# Patient Record
Sex: Female | Born: 1960 | Race: White | Hispanic: No | Smoking: Former smoker
Health system: Southern US, Community
[De-identification: ages and names within clinical notes are randomized; demographics above are authoritative.]

## PROBLEM LIST (undated history)

## (undated) DIAGNOSIS — R51 Headache: Secondary | ICD-10-CM

## (undated) DIAGNOSIS — Z789 Other specified health status: Secondary | ICD-10-CM

## (undated) DIAGNOSIS — N3281 Overactive bladder: Secondary | ICD-10-CM

## (undated) DIAGNOSIS — T4145XA Adverse effect of unspecified anesthetic, initial encounter: Secondary | ICD-10-CM

## (undated) DIAGNOSIS — E785 Hyperlipidemia, unspecified: Secondary | ICD-10-CM

## (undated) DIAGNOSIS — R238 Other skin changes: Secondary | ICD-10-CM

## (undated) DIAGNOSIS — Z460 Encounter for fitting and adjustment of spectacles and contact lenses: Secondary | ICD-10-CM

## (undated) DIAGNOSIS — R112 Nausea with vomiting, unspecified: Secondary | ICD-10-CM

## (undated) DIAGNOSIS — F3340 Major depressive disorder, recurrent, in remission, unspecified: Secondary | ICD-10-CM

## (undated) DIAGNOSIS — C801 Malignant (primary) neoplasm, unspecified: Secondary | ICD-10-CM

## (undated) DIAGNOSIS — Z9889 Other specified postprocedural states: Secondary | ICD-10-CM

## (undated) DIAGNOSIS — T8859XA Other complications of anesthesia, initial encounter: Secondary | ICD-10-CM

## (undated) HISTORY — PX: CHOLECYSTECTOMY: SHX55

## (undated) HISTORY — DX: Major depressive disorder, recurrent, in remission, unspecified: F33.40

## (undated) HISTORY — DX: Overactive bladder: N32.81

## (undated) HISTORY — DX: Hyperlipidemia, unspecified: E78.5

---
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
## 1998-08-09 ENCOUNTER — Other Ambulatory Visit: Admission: RE | Admit: 1998-08-09 | Discharge: 1998-08-09 | Payer: Self-pay | Admitting: Obstetrics & Gynecology

## 1999-02-26 HISTORY — PX: SHOULDER ARTHROSCOPY: SHX128

## 1999-10-17 ENCOUNTER — Ambulatory Visit (HOSPITAL_BASED_OUTPATIENT_CLINIC_OR_DEPARTMENT_OTHER): Admission: RE | Admit: 1999-10-17 | Discharge: 1999-10-17 | Payer: Self-pay | Admitting: Orthopedic Surgery

## 1999-12-20 ENCOUNTER — Other Ambulatory Visit: Admission: RE | Admit: 1999-12-20 | Discharge: 1999-12-20 | Payer: Self-pay | Admitting: Obstetrics & Gynecology

## 2000-05-02 ENCOUNTER — Emergency Department (HOSPITAL_COMMUNITY): Admission: EM | Admit: 2000-05-02 | Discharge: 2000-05-02 | Payer: Self-pay | Admitting: Emergency Medicine

## 2001-01-07 ENCOUNTER — Other Ambulatory Visit: Admission: RE | Admit: 2001-01-07 | Discharge: 2001-01-07 | Payer: Self-pay | Admitting: Obstetrics & Gynecology

## 2002-02-02 ENCOUNTER — Other Ambulatory Visit: Admission: RE | Admit: 2002-02-02 | Discharge: 2002-02-02 | Payer: Self-pay | Admitting: Obstetrics & Gynecology

## 2002-02-25 HISTORY — PX: CARPAL TUNNEL RELEASE: SHX101

## 2002-05-12 ENCOUNTER — Ambulatory Visit (HOSPITAL_BASED_OUTPATIENT_CLINIC_OR_DEPARTMENT_OTHER): Admission: RE | Admit: 2002-05-12 | Discharge: 2002-05-12 | Payer: Self-pay | Admitting: Orthopedic Surgery

## 2003-02-04 ENCOUNTER — Other Ambulatory Visit: Admission: RE | Admit: 2003-02-04 | Discharge: 2003-02-04 | Payer: Self-pay | Admitting: Obstetrics & Gynecology

## 2003-10-05 ENCOUNTER — Encounter: Admission: RE | Admit: 2003-10-05 | Discharge: 2003-10-05 | Payer: Self-pay | Admitting: Obstetrics & Gynecology

## 2004-02-08 ENCOUNTER — Other Ambulatory Visit: Admission: RE | Admit: 2004-02-08 | Discharge: 2004-02-08 | Payer: Self-pay | Admitting: Obstetrics & Gynecology

## 2005-12-10 ENCOUNTER — Encounter: Admission: RE | Admit: 2005-12-10 | Discharge: 2005-12-10 | Payer: Self-pay | Admitting: Family Medicine

## 2005-12-12 ENCOUNTER — Encounter: Admission: RE | Admit: 2005-12-12 | Discharge: 2006-03-12 | Payer: Self-pay | Admitting: Orthopedic Surgery

## 2006-12-29 ENCOUNTER — Encounter: Admission: RE | Admit: 2006-12-29 | Discharge: 2006-12-29 | Payer: Self-pay | Admitting: Family Medicine

## 2009-05-29 ENCOUNTER — Encounter: Admission: RE | Admit: 2009-05-29 | Discharge: 2009-05-29 | Payer: Self-pay | Admitting: Family Medicine

## 2010-03-18 ENCOUNTER — Encounter: Payer: Self-pay | Admitting: Family Medicine

## 2010-05-21 ENCOUNTER — Other Ambulatory Visit: Payer: Self-pay | Admitting: Obstetrics & Gynecology

## 2010-05-21 DIAGNOSIS — Z1231 Encounter for screening mammogram for malignant neoplasm of breast: Secondary | ICD-10-CM

## 2010-06-11 ENCOUNTER — Ambulatory Visit: Payer: Self-pay

## 2010-07-13 NOTE — Op Note (Signed)
NAME:  Regina Lindsey, Regina Lindsey                         ACCOUNT NO.:  [id number]   MEDICAL RECORD NO.:  [id number]                   PATIENT TYPE:  AMB   LOCATION:  DSC                                  FACILITY:  [facility]   PHYSICIAN:  Feliberto Gottron. Turner Daniels, M.D.                DATE OF BIRTH:  07/08/1960   DATE OF PROCEDURE:  05/12/2002  DATE OF DISCHARGE:                                 OPERATIVE REPORT   PREOPERATIVE DIAGNOSIS:  Right carpal tunnel syndrome.   POSTOPERATIVE DIAGNOSIS:  Right carpal tunnel syndrome.   OPERATION PERFORMED:  Right carpal tunnel release.   SURGEON:  Feliberto Gottron. Turner Daniels, M.D.   ASSISTANTLaural Benes. Jannet Mantis.   ANESTHESIA:  IV regional.   ESTIMATED BLOOD LOSS:  Minimal.   FLUIDS REPLACED:  500 cc crystalloid.   DRAINS:  None.   TOURNIQUET TIME:  None.   INDICATIONS FOR PROCEDURE:  The patient is a 50 year old woman with EMG  proven right carpal tunnel syndrome which has failed conservative treatment  with anti-inflammatory medicines, bracing and observation.  Now desires  elective right carpal tunnel release.  The risks and benefits of surgery  were understood by the patient.   DESCRIPTION OF PROCEDURE:  The patient was identified by arm band and taken  to the operating room at [facility] where appropriate  anesthetic monitors were attached and IV regional anesthesia induced into  the right upper extremity using a forearm tourniquet technique.  The right  hand was then prepped and draped in the usual sterile fashion from the  fingertips to the tourniquet and we began the procedure by making a midline  volar incision starting at the wrist flexion crease and going distally for  approximately 3 to  4 cm through the skin and subcutaneous tissue, down to the transverse palmar  fascia.  A small incision was made in the transverse palmar fascia distally  allowing passage of a Therapist, nutritional into the carpal tunnel.  We kept the  [facility]  volar on the ligament and then cut down onto the [facility] performing the carpal tunnel release and being careful to avoid any  entanglement with the median nerve.  This incision in the ligament was run  up into the palmar fascia using the black-handled scissors under direct  visualization.  We then examined the median nerve, found it to be hour-  glassed down as it passed through the carpal tunnel and thoroughly  decompressed now.  There were no ganglia or masses found in the carpal  tunnel.  The wound was washed out with normal saline solution and  then the skin only was closed with running 4-0 nylon suture.  A dressing of  Xeroform, 4 x 4 dressing sponges, Webril and Ace wrap was applied.  The  tourniquet was let down and the patient was awakened and taken to the  recovery  room without difficulty.                                                Feliberto Gottron. Turner Daniels, M.D.    Ovid Curd  D:  05/12/2002  T:  05/12/2002  Job:  [id number]

## 2010-07-13 NOTE — Op Note (Signed)
[place]  Patient:    Regina Lindsey, Regina Lindsey                      MRN: [id number] Proc. Date: 10/17/99 Adm. Date:  [id number] Attending:  Alinda Deem                           Operative Report  PREOPERATIVE DIAGNOSIS:  Right shoulder SLAP tear and impingement.  POSTOPERATIVE DIAGNOSIS:  Right shoulder SLAP tear and impingement.  OPERATION PERFORMED:  Right shoulder arthroscopic anterior inferior acromioplasty and removal of degenerative tear of the superior labrum.  The labral anchor was actually stable.  SURGEON:  Alinda Deem, M.D.  ASSISTANT:  Dorthula Matas, P.A.-C.  ANESTHESIA:  Interscalene block and general LMA.  ESTIMATED BLOOD LOSS:  Minimal.  FLUID REPLACEMENT:  1L crystalloid.  DRAINS:  None.  TOURNIQUET TIME:  None.  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  She also has signs and symptoms consistent with impingement.  In any event because of failure of conservative measures, she desires arthroscopic evaluation and treatment of her right shoulder.  DESCRIPTION OF PROCEDURE:  The patient was identified by arm band and taken to the operating room at [facility] where the appropriate anesthetic monitors were attached and interscalene block anesthesia induced to the right upper extremity followed by general LMA anesthesia.  She was then placed in the beach chair position and the right upper extremity prepped and draped in the usual sterile fashion from the wrist to the hemithorax.  The skin along the anterolateral and posterior aspects of the acromion process was infiltrated with 2 to 3 cc of 0.5% Marcaine with epinephrine solution and no back flow was noted.  A #11 blade was used to make standard portals, 1.5 cm anterior  to the [facility] joint, lateral to the junction of the middle and posterior thirds of the acromion and posterior to the posterolateral corner of the acromion process.  The inflow was placed anteriorly.  The arthroscope laterally and a 4.2 mm barracuda sucker shaver posteriorly allowing removal of inflamed subacromial bursa to the identify the CA ligament as well as the subacromial spur.  Underwater electrocautery was used to control bleeding and gravity was used for the fluid inflow.  Using a 4.5 mm hooded Vortex bur, we then resected the subacromial spur and inspected the external aspects of the rotator cuff and found it to be intact.  The arthroscope was then repositioned into the glenohumeral joint where I identified a degenerative fray tear of the superior labrum which was removed with a 4.2 mm barracuda sucker shaver.  A small amount of chondromalacia of the glenoid was incidentally debrided grade 2.  The humeral head was in excellent condition as were the subscapularis tendons, the biceps tendon, supra- and infraspinatus insertions.  At this point the shoulder was washed out with normal saline solution and the arthroscopic instruments were removed.  A dressing of Xeroform, 4 x 8 dressing sponges and paper tape applied with the sling.  The patient was then awakened and taken to the recovery room without difficulty. DD:  10/17/99 TD:  10/18/99 Job: [id number]

## 2011-01-25 ENCOUNTER — Other Ambulatory Visit: Payer: Self-pay | Admitting: Family Medicine

## 2011-01-25 ENCOUNTER — Ambulatory Visit
Admission: RE | Admit: 2011-01-25 | Discharge: 2011-01-25 | Disposition: A | Discharge status: Home / Self Care | Payer: PRIVATE HEALTH INSURANCE | Source: Ambulatory Visit | Attending: Family Medicine | Admitting: Family Medicine

## 2011-01-25 DIAGNOSIS — R1032 Left lower quadrant pain: Secondary | ICD-10-CM

## 2011-01-25 MED ORDER — IOHEXOL 300 MG/ML  SOLN
125.0000 mL | Freq: Once | INTRAMUSCULAR | Status: AC | PRN
  Administered 2011-01-25: 125 mL via INTRAVENOUS

## 2011-08-27 ENCOUNTER — Other Ambulatory Visit: Payer: Self-pay | Admitting: Obstetrics & Gynecology

## 2011-08-27 DIAGNOSIS — Z1231 Encounter for screening mammogram for malignant neoplasm of breast: Secondary | ICD-10-CM

## 2011-09-09 ENCOUNTER — Ambulatory Visit: Payer: PRIVATE HEALTH INSURANCE

## 2012-01-27 ENCOUNTER — Ambulatory Visit
Admission: RE | Admit: 2012-01-27 | Discharge: 2012-01-27 | Disposition: A | Discharge status: Home / Self Care | Payer: BC Managed Care – PPO | Source: Ambulatory Visit | Attending: Obstetrics & Gynecology | Admitting: Obstetrics & Gynecology

## 2012-01-27 DIAGNOSIS — Z1231 Encounter for screening mammogram for malignant neoplasm of breast: Secondary | ICD-10-CM

## 2012-03-23 ENCOUNTER — Encounter (HOSPITAL_BASED_OUTPATIENT_CLINIC_OR_DEPARTMENT_OTHER): Payer: Self-pay | Admitting: *Deleted

## 2012-03-23 NOTE — Progress Notes (Signed)
Pt denies any heart or resp problems-denies sleep apnea [facility]

## 2012-03-26 ENCOUNTER — Other Ambulatory Visit: Payer: Self-pay | Admitting: Orthopedic Surgery

## 2012-03-27 ENCOUNTER — Encounter (HOSPITAL_BASED_OUTPATIENT_CLINIC_OR_DEPARTMENT_OTHER): Payer: Self-pay | Admitting: Certified Registered Nurse Anesthetist

## 2012-03-27 ENCOUNTER — Ambulatory Visit (HOSPITAL_BASED_OUTPATIENT_CLINIC_OR_DEPARTMENT_OTHER)
Admission: RE | Admit: 2012-03-27 | Discharge: 2012-03-27 | Disposition: A | Discharge status: Home / Self Care | Payer: BC Managed Care – PPO | Source: Ambulatory Visit | Attending: Orthopedic Surgery | Admitting: Orthopedic Surgery

## 2012-03-27 ENCOUNTER — Encounter (HOSPITAL_BASED_OUTPATIENT_CLINIC_OR_DEPARTMENT_OTHER): Payer: Self-pay | Admitting: *Deleted

## 2012-03-27 ENCOUNTER — Ambulatory Visit (HOSPITAL_BASED_OUTPATIENT_CLINIC_OR_DEPARTMENT_OTHER): Payer: BC Managed Care – PPO | Admitting: Certified Registered Nurse Anesthetist

## 2012-03-27 ENCOUNTER — Encounter (HOSPITAL_BASED_OUTPATIENT_CLINIC_OR_DEPARTMENT_OTHER)
Admission: RE | Disposition: A | Discharge status: Home / Self Care | Payer: Self-pay | Source: Ambulatory Visit | Attending: Orthopedic Surgery

## 2012-03-27 DIAGNOSIS — Z87891 Personal history of nicotine dependence: Secondary | ICD-10-CM | POA: Insufficient documentation

## 2012-03-27 DIAGNOSIS — M23349 Other meniscus derangements, anterior horn of lateral meniscus, unspecified knee: Secondary | ICD-10-CM | POA: Insufficient documentation

## 2012-03-27 DIAGNOSIS — S83289A Other tear of lateral meniscus, current injury, unspecified knee, initial encounter: Secondary | ICD-10-CM

## 2012-03-27 DIAGNOSIS — Z6841 Body Mass Index (BMI) 40.0 and over, adult: Secondary | ICD-10-CM | POA: Insufficient documentation

## 2012-03-27 HISTORY — DX: Other specified health status: Z78.9

## 2012-03-27 HISTORY — DX: Headache: R51

## 2012-03-27 HISTORY — DX: Adverse effect of unspecified anesthetic, initial encounter: T41.45XA

## 2012-03-27 HISTORY — DX: Other complications of anesthesia, initial encounter: T88.59XA

## 2012-03-27 HISTORY — DX: Encounter for fitting and adjustment of spectacles and contact lenses: Z46.0

## 2012-03-27 HISTORY — PX: KNEE ARTHROSCOPY WITH LATERAL MENISECTOMY: SHX6193

## 2012-03-27 SURGERY — ARTHROSCOPY, KNEE, WITH LATERAL MENISCECTOMY
Anesthesia: General | Site: Knee | Laterality: Left | Wound class: Clean

## 2012-03-27 MED ORDER — OXYCODONE-ACETAMINOPHEN 5-325 MG PO TABS: 1.0000 | ORAL_TABLET | Freq: Four times a day (QID) | ORAL | Status: DC | PRN

## 2012-03-27 MED ORDER — OXYCODONE HCL 5 MG PO TABS: 5.0000 mg | ORAL_TABLET | Freq: Once | ORAL | Status: DC | PRN

## 2012-03-27 MED ORDER — FENTANYL CITRATE 0.05 MG/ML IJ SOLN: 50.0000 ug | INTRAMUSCULAR | Status: DC | PRN

## 2012-03-27 MED ORDER — PROPOFOL 10 MG/ML IV BOLUS
INTRAVENOUS | Status: DC | PRN
  Administered 2012-03-27: 200 mg via INTRAVENOUS

## 2012-03-27 MED ORDER — ONDANSETRON HCL 4 MG/2ML IJ SOLN: 4.0000 mg | Freq: Four times a day (QID) | INTRAMUSCULAR | Status: DC | PRN

## 2012-03-27 MED ORDER — VANCOMYCIN HCL 1000 MG IV SOLR
1000.0000 mg | INTRAVENOUS | Status: DC | PRN
  Administered 2012-03-27: 1000 mg via INTRAVENOUS

## 2012-03-27 MED ORDER — PROMETHAZINE HCL 25 MG PO TABS: 25.0000 mg | ORAL_TABLET | Freq: Four times a day (QID) | ORAL | Status: DC | PRN

## 2012-03-27 MED ORDER — DEXAMETHASONE SODIUM PHOSPHATE 4 MG/ML IJ SOLN
INTRAMUSCULAR | Status: DC | PRN
  Administered 2012-03-27: 10 mg via INTRAVENOUS

## 2012-03-27 MED ORDER — CEFAZOLIN SODIUM-DEXTROSE 2-3 GM-% IV SOLR: 2.0000 g | INTRAVENOUS | Status: DC

## 2012-03-27 MED ORDER — FENTANYL CITRATE 0.05 MG/ML IJ SOLN
INTRAMUSCULAR | Status: DC | PRN
  Administered 2012-03-27 (×2): 25 ug via INTRAVENOUS
  Administered 2012-03-27: 50 ug via INTRAVENOUS
  Administered 2012-03-27 (×2): 25 ug via INTRAVENOUS

## 2012-03-27 MED ORDER — LACTATED RINGERS IV SOLN
INTRAVENOUS | Status: DC
  Administered 2012-03-27: 07:00:00 via INTRAVENOUS

## 2012-03-27 MED ORDER — ONDANSETRON HCL 4 MG/2ML IJ SOLN
INTRAMUSCULAR | Status: DC | PRN
  Administered 2012-03-27: 4 mg via INTRAVENOUS

## 2012-03-27 MED ORDER — BUPIVACAINE-EPINEPHRINE 0.5% -1:200000 IJ SOLN
INTRAMUSCULAR | Status: DC | PRN
  Administered 2012-03-27: 30 mL

## 2012-03-27 MED ORDER — LIDOCAINE HCL (CARDIAC) 20 MG/ML IV SOLN
INTRAVENOUS | Status: DC | PRN
  Administered 2012-03-27: 60 mg via INTRAVENOUS

## 2012-03-27 MED ORDER — SODIUM CHLORIDE 0.9 % IR SOLN
Status: DC | PRN
  Administered 2012-03-27: 3000 mL

## 2012-03-27 MED ORDER — OXYCODONE HCL 5 MG/5ML PO SOLN: 5.0000 mg | Freq: Once | ORAL | Status: DC | PRN

## 2012-03-27 MED ORDER — HYDROMORPHONE HCL PF 1 MG/ML IJ SOLN
0.2500 mg | INTRAMUSCULAR | Status: DC | PRN
  Administered 2012-03-27: 0.5 mg via INTRAVENOUS

## 2012-03-27 SURGICAL SUPPLY — 46 items
APL SKNCLS STERI-STRIP NONHPOA (GAUZE/BANDAGES/DRESSINGS) ×1
BANDAGE ELASTIC 6 VELCRO ST LF (GAUZE/BANDAGES/DRESSINGS) ×2 IMPLANT
BANDAGE ESMARK 6X9 LF (GAUZE/BANDAGES/DRESSINGS) IMPLANT
BENZOIN TINCTURE PRP APPL 2/3 (GAUZE/BANDAGES/DRESSINGS) ×2 IMPLANT
BLADE CUTTER GATOR 3.5 (BLADE) ×2 IMPLANT
BNDG CMPR 9X6 STRL LF SNTH (GAUZE/BANDAGES/DRESSINGS)
BNDG ESMARK 6X9 LF (GAUZE/BANDAGES/DRESSINGS)
CANISTER OMNI JUG 16 LITER (MISCELLANEOUS) ×2 IMPLANT
CANISTER SUCTION 2500CC (MISCELLANEOUS) IMPLANT
CLOTH BEACON ORANGE TIMEOUT ST (SAFETY) ×2 IMPLANT
CUFF TOURNIQUET SINGLE 34IN LL (TOURNIQUET CUFF) IMPLANT
CUTTER KNOT PUSHER 2-0 FIBERWI (INSTRUMENTS) IMPLANT
CUTTER MENISCUS  4.2MM (BLADE)
CUTTER MENISCUS 4.2MM (BLADE) IMPLANT
DRAPE ARTHROSCOPY W/POUCH 90 (DRAPES) ×2 IMPLANT
DURAPREP 26ML APPLICATOR (WOUND CARE) ×2 IMPLANT
ELECT MENISCUS 165MM 90D (ELECTRODE) IMPLANT
ELECT REM PT RETURN 9FT ADLT (ELECTROSURGICAL)
ELECTRODE REM PT RTRN 9FT ADLT (ELECTROSURGICAL) IMPLANT
GLOVE BIO SURGEON STRL SZ 6.5 (GLOVE) ×1 IMPLANT
GLOVE BIO SURGEON STRL SZ8 (GLOVE) ×2 IMPLANT
GLOVE BIOGEL PI IND STRL 7.0 (GLOVE) IMPLANT
GLOVE BIOGEL PI IND STRL 8 (GLOVE) ×2 IMPLANT
GLOVE BIOGEL PI INDICATOR 7.0 (GLOVE) ×1
GLOVE BIOGEL PI INDICATOR 8 (GLOVE) ×1
GLOVE ORTHO TXT STRL SZ7.5 (GLOVE) ×2 IMPLANT
GOWN BRE IMP PREV XXLGXLNG (GOWN DISPOSABLE) ×3 IMPLANT
GOWN PREVENTION PLUS XLARGE (GOWN DISPOSABLE) ×1 IMPLANT
HOLDER KNEE FOAM BLUE (MISCELLANEOUS) ×2 IMPLANT
IMMOBILIZER KNEE 24 THIGH 36 (MISCELLANEOUS) IMPLANT
IMMOBILIZER KNEE 24 UNIV (MISCELLANEOUS) ×2
IV NS IRRIG 3000ML ARTHROMATIC (IV SOLUTION) ×4 IMPLANT
KNEE WRAP E Z 3 GEL PACK (MISCELLANEOUS) ×2 IMPLANT
PACK ARTHROSCOPY DSU (CUSTOM PROCEDURE TRAY) ×2 IMPLANT
PACK BASIN DAY SURGERY FS (CUSTOM PROCEDURE TRAY) ×2 IMPLANT
PENCIL BUTTON HOLSTER BLD 10FT (ELECTRODE) IMPLANT
SET ARTHROSCOPY TUBING (MISCELLANEOUS) ×2
SET ARTHROSCOPY TUBING LN (MISCELLANEOUS) ×1 IMPLANT
SLEEVE SCD COMPRESS KNEE MED (MISCELLANEOUS) IMPLANT
SPONGE GAUZE 4X4 12PLY (GAUZE/BANDAGES/DRESSINGS) ×2 IMPLANT
STRIP CLOSURE SKIN 1/2X4 (GAUZE/BANDAGES/DRESSINGS) ×2 IMPLANT
SUT MNCRL AB 4-0 PS2 18 (SUTURE) ×2 IMPLANT
TOWEL OR 17X24 6PK STRL BLUE (TOWEL DISPOSABLE) ×2 IMPLANT
TOWEL OR NON WOVEN STRL DISP B (DISPOSABLE) ×2 IMPLANT
WAND STAR VAC 90 (SURGICAL WAND) IMPLANT
WATER STERILE IRR 1000ML POUR (IV SOLUTION) ×2 IMPLANT

## 2012-03-27 NOTE — Anesthesia Postprocedure Evaluation (Signed)
Anesthesia Post Note  Patient: Regina Lindsey  Procedure(s) Performed: Procedure(s) (LRB): KNEE ARTHROSCOPY WITH LATERAL MENISECTOMY (Left)  Anesthesia type: General  Patient location: PACU  Post pain: Pain level controlled and Adequate analgesia  Post assessment: Post-op Vital signs reviewed, Patient's Cardiovascular Status Stable, Respiratory Function Stable, Patent Airway and Pain level controlled  Last Vitals:  Filed Vitals:   03/27/12 0830  BP: 144/94  Pulse: 99  Temp:   Resp: 15    Post vital signs: Reviewed and stable  Level of consciousness: awake, alert  and oriented  Complications: No apparent anesthesia complications

## 2012-03-27 NOTE — Transfer of Care (Signed)
Immediate Anesthesia Transfer of Care Note  Patient: Regina Lindsey  Procedure(s) Performed: Procedure(s) (LRB) with comments: KNEE ARTHROSCOPY WITH LATERAL MENISECTOMY (Left) - left knee arthroscopy with partial lateral menisectomy  Patient Location: PACU  Anesthesia Type:General  Level of Consciousness: awake, alert , oriented and patient cooperative  Airway & Oxygen Therapy: Patient Spontanous Breathing and Patient connected to face mask oxygen  Post-op Assessment: Report given to PACU RN and Post -op Vital signs reviewed and stable  Post vital signs: Reviewed and stable  Complications: No apparent anesthesia complications

## 2012-03-27 NOTE — Anesthesia Preprocedure Evaluation (Signed)
Anesthesia Evaluation  Patient identified by MRN, date of birth, ID band Patient awake    Reviewed: Allergy & Precautions, H&P , NPO status , Patient's Chart, lab work & pertinent test results  Airway Mallampati: II  Neck ROM: full    Dental   Pulmonary former smoker,          Cardiovascular     Neuro/Psych  Headaches,    GI/Hepatic   Endo/Other  Morbid obesity  Renal/GU      Musculoskeletal   Abdominal   Peds  Hematology   Anesthesia Other Findings   Reproductive/Obstetrics                           Anesthesia Physical Anesthesia Plan  ASA: II  Anesthesia Plan: General   Post-op Pain Management:    Induction: Intravenous  Airway Management Planned: LMA  Additional Equipment:   Intra-op Plan:   Post-operative Plan:   Informed Consent: I have reviewed the patients History and Physical, chart, labs and discussed the procedure including the risks, benefits and alternatives for the proposed anesthesia with the patient or authorized representative who has indicated his/her understanding and acceptance.     Plan Discussed with: CRNA and Surgeon  Anesthesia Plan Comments:         Anesthesia Quick Evaluation

## 2012-03-27 NOTE — H&P (Signed)
  PREOPERATIVE H&P  Chief Complaint: medial left knee pain HPI: Regina Lindsey is a 52 y.o. female who presents for preoperative history and physical with a diagnosis of left medial meniscus tear. Symptoms are rated as moderate to severe, and have been worsening.  This is significantly impairing activities of daily living.  She has elected for surgical management. She has failed injections, activity modification, NSAIDs.  This has been going on since November.  Past Medical History  Diagnosis Date  . No pertinent past medical history   . Complication of anesthesia     hard to wake up  . Headache   . Contact lens/glasses fitting     wears contacts or glasses   Past Surgical History  Procedure Date  . Tonsillectomy 1980    age 69  . Carpal tunnel release 2004    right  . Shoulder arthroscopy 2001    right  . Cholecystectomy   . Orif ankle fracture 1980    right   History   Social History  . Marital Status: Divorced    Spouse Name: N/A    Number of Children: N/A  . Years of Education: N/A   Social History Main Topics  . Smoking status: Former Smoker    Quit date: 03/23/2005  . Smokeless tobacco: None  . Alcohol Use: No  . Drug Use: No  . Sexually Active:    Other Topics Concern  . None   Social History Narrative  . None   History reviewed. No pertinent family history. Allergies  Allergen Reactions  . Celebrex (Celecoxib) Anaphylaxis  . Penicillins Anaphylaxis  . Sulfa Antibiotics Anaphylaxis  . Codeine Nausea And Vomiting  . Versed (Midazolam)     Opposite effect-hyper-mean   Prior to Admission medications   Medication Sig Start Date End Date Taking? Authorizing Provider  cyclobenzaprine (FLEXERIL) 10 MG tablet Take 10 mg by mouth 3 (three) times daily as needed.   Yes Historical Provider, MD  ibuprofen (ADVIL,MOTRIN) 800 MG tablet Take 800 mg by mouth every 8 (eight) hours as needed.   Yes Historical Provider, MD  eletriptan (RELPAX) 20 MG tablet One  tablet by mouth at onset of headache. May repeat in 2 hours if headache persists or recurs. may repeat in 2 hours if necessary    Historical Provider, MD     Positive ROS: All other systems have been reviewed and were otherwise negative with the exception of those mentioned in the HPI and as above.  Physical Exam: General: Alert, no acute distress Cardiovascular: No pedal edema Respiratory: No cyanosis, no use of accessory musculature GI: No organomegaly, abdomen is soft and non-tender Skin: No lesions in the area of chief complaint Neurologic: Sensation intact distally Psychiatric: Patient is competent for consent with normal mood and affect Lymphatic: No axillary or cervical lymphadenopathy  MUSCULOSKELETAL: left knee has medial joint line tenderness.  Assessment: LEFT KNEE: medial meniscus tear  Plan: Plan for Procedure(s): KNEE ARTHROSCOPY WITH medial MENISECTOMY  The risks benefits and alternatives were discussed with the patient including but not limited to the risks of nonoperative treatment, versus surgical intervention including infection, bleeding, nerve injury,  blood clots, cardiopulmonary complications, morbidity, mortality, incomplete relief of pain, among others, and they were willing to proceed.   Odell Choung P, MD Cell [phone] Pager [phone]  03/27/2012 7:11 AM

## 2012-03-27 NOTE — Anesthesia Procedure Notes (Signed)
Procedure Name: LMA Insertion Date/Time: 03/27/2012 7:39 AM Performed by: Codie Hainer D Pre-anesthesia Checklist: Patient identified, Emergency Drugs available, Suction available and Patient being monitored Patient Re-evaluated:Patient Re-evaluated prior to inductionOxygen Delivery Method: Circle System Utilized Preoxygenation: Pre-oxygenation with 100% oxygen Intubation Type: IV induction Ventilation: Mask ventilation without difficulty LMA: LMA inserted LMA Size: 4.0 Number of attempts: 1 Airway Equipment and Method: bite block Placement Confirmation: positive ETCO2 Tube secured with: Tape Dental Injury: Teeth and Oropharynx as per pre-operative assessment

## 2012-03-27 NOTE — Op Note (Signed)
03/27/2012  8:20 AM  PATIENT:  Regina Lindsey    PRE-OPERATIVE DIAGNOSIS:  Left knee occult medial meniscus tear  POST-OPERATIVE DIAGNOSIS:  Left knee anterior horn lateral meniscus tear  PROCEDURE:  KNEE ARTHROSCOPY WITH LATERAL MENISECTOMY  SURGEON:  Eulas Post, MD  PHYSICIAN ASSISTANT: None  ANESTHESIA:   General  PREOPERATIVE INDICATIONS:  Regina Lindsey is a  52 y.o. female who injured her knee while twisting back in November. She has had persistent medial sided knee pain. She had an MRI preoperatively that was questionable for medial meniscal pathology, but not diagnostic. She failed conservative measures and was miserable and wished for surgical intervention. She also has morbid obesity, increasing her risk, with a BMI of 46.  The risks benefits and alternatives were discussed with the patient preoperatively including but not limited to the risks of infection, bleeding, nerve injury, cardiopulmonary complications, the need for revision surgery, among others, and the patient was willing to proceed.  OPERATIVE IMPLANTS: None  OPERATIVE FINDINGS: The patellofemoral joint had some grade 2 changes on the medial side. The medial femoral condyle had softening with grade 2 changes on the cartilage. The medial meniscus was completely intact. The lateral meniscus had a tear in the anterior horn. The lateral articular cartilage was intact. The anterior cruciate ligament and PCL were intact.  OPERATIVE PROCEDURE: The patient is brought to the operating room and placed in the supine position. General anesthesia was administered. IV vancomycin was given do to a anaphylactic reaction to penicillin. The left lower extremity was prepped and draped in usual sterile fashion. Time out was performed. Diagnostic arthroscopy was carried out with the above-named findings. The medial compartment was extremely tight, and it was very difficult to get to the posterior horn. During the process of obtaining  access, her MCL stretched, probably to a grade 2 sprain. I did get adequate access and closely inspected the medial meniscus and it was found to be intact throughout.  The lateral meniscus was inspected and was intact posteriorly as well as laterally, but anteriorly there was a V shaped tear. This was debrided back to stable rim.  The arthroscopic instruments were removed, and the knee was drained, injected, and I also injected along the distribution of the MCL, with half percent Marcaine, and then repaired the portals with Monocryl followed by Steri-Strips and sterile gauze and an ice pack and a knee immobilizer. She was awakened and returned to the PACU in stable and satisfactory condition.

## 2012-03-30 ENCOUNTER — Encounter (HOSPITAL_BASED_OUTPATIENT_CLINIC_OR_DEPARTMENT_OTHER): Payer: Self-pay | Admitting: Orthopedic Surgery

## 2013-01-07 ENCOUNTER — Other Ambulatory Visit: Payer: Self-pay

## 2013-01-07 DIAGNOSIS — Z1231 Encounter for screening mammogram for malignant neoplasm of breast: Secondary | ICD-10-CM

## 2013-02-08 ENCOUNTER — Ambulatory Visit: Payer: BC Managed Care – PPO

## 2013-02-22 ENCOUNTER — Ambulatory Visit: Payer: BC Managed Care – PPO

## 2013-04-17 ENCOUNTER — Encounter: Payer: Self-pay | Admitting: *Deleted

## 2013-04-17 DIAGNOSIS — K5792 Diverticulitis of intestine, part unspecified, without perforation or abscess without bleeding: Secondary | ICD-10-CM | POA: Insufficient documentation

## 2013-10-05 ENCOUNTER — Ambulatory Visit: Payer: BC Managed Care – PPO

## 2013-12-29 ENCOUNTER — Ambulatory Visit: Payer: BC Managed Care – PPO

## 2014-12-19 ENCOUNTER — Other Ambulatory Visit: Payer: Self-pay

## 2014-12-19 DIAGNOSIS — Z1231 Encounter for screening mammogram for malignant neoplasm of breast: Secondary | ICD-10-CM

## 2015-02-06 ENCOUNTER — Ambulatory Visit: Payer: Self-pay

## 2015-03-07 ENCOUNTER — Ambulatory Visit: Payer: Self-pay

## 2015-03-29 ENCOUNTER — Ambulatory Visit
Admission: RE | Admit: 2015-03-29 | Discharge: 2015-03-29 | Disposition: A | Discharge status: Home / Self Care | Payer: BLUE CROSS/BLUE SHIELD | Source: Ambulatory Visit

## 2015-03-29 DIAGNOSIS — Z1231 Encounter for screening mammogram for malignant neoplasm of breast: Secondary | ICD-10-CM

## 2015-07-10 ENCOUNTER — Encounter (HOSPITAL_COMMUNITY): Payer: Self-pay

## 2015-07-18 NOTE — H&P (Signed)
55yo postmenopausal female who presents for hysteroscopy, D&C, polypectomy due to uterine polyp.  An TVUS had been performed due to dyspareunia.  The US revealed a thickened endometrium measuring 1.07cm and blood flow. A follow up SHG was performed that showed 6cm anteverted uterus within the cavity- 2.1x1.1cm hyperechoic mass, thin endometrial walls. Pt denies any vaginal bleeding, discharge or irritation.     Current Medications  Taking   Zantac(RaNITidine HCl) 150 MG Tablet 1 tablet Orally PRN   Xanax(ALPRAZolam) 0.25 MG Tablet 1 tablet Orally PRN   Mobic(Meloxicam) 15 mg Tablet 1 tablet Orally Once a day   EpiPen 2-Pak(EPINEPHrine) 0.3 MG/0.3ML Device as directed Injection PRN   Naproxen 500 MG Tablet 1 tablet Orally PRN   Magnesium 500 MG Tablet 1 tablet Orally Once a day   Relpax(Eletriptan Hydrobromide) 40 MG Tablet 1 tablet at onset of headache, may repeat after 2 hours if headache returns Orally PRN   Ibuprofen 800 MG Tablet 1 tablet with food or milk Orally Ever 8 hours as needed    Past Medical History  Cervical dysplasia >2yrs ago.   Mixed hyperlipidemia.   Menopausal disorder.   Migraine.   Morbid obesity.     Allergies  Codeine (for allergy): n/v: Side Effects  Penicillin (for allergy): anaphylaxis: Allergy  Sulfa drugs (for allergy): anaphylaxis: Allergy  Celebrex: anaphylaxis: Allergy  versed: Makes her mean: Side Effects  Neomycin Sulfate   Surgical History  open reduction & fixation (R) ankle   Lap Choly   tonsillectomy   bil carpel tunnel torn (R) shoulder   adenoidectomy   colonoscopy 8/13   Family History  Father: alive, colon polyp  Mother: alive, heart disease  diagnosed with DM, HTN  Paternal [place] Father: deceased, colon polyp  Brother 1: alive, CAD at 68  denies any GYN family cancer hx.   Social History  General:  Tobacco use  cigarettes: Former smoker 10-15 years ago Tobacco history last updated 04/26/2015 no Alcohol.  no  Recreational drug use.  Marital Status: single, Divorced.  OCCUPATION: nurse at [employer].  no Tobacco Exposure.    Gyn History  Sexual activity currently sexually active.  Periods : postmenopausal.  LMP 5 years ago.  Last pap smear date 12/2013 Normal .  Last mammogram date 03/29/2015.  Abnormal pap smear assessed with colposcopy 2010.  STD HPV.    OB History  Pregnancy # 1 miscarriage.  Pregnancy # 2 miscarriage.      Review of Systems  CONSTITUTIONAL:  no Chills. no Fever. no Night sweats.  HEENT:  Blurrred vision no. no Double vision.  CARDIOLOGY:  no Chest pain.  RESPIRATORY:  no Shortness of breath. no Cough.  UROLOGY:  no Urinary frequency. no Urinary incontinence. no Urinary urgency.  GASTROENTEROLOGY:  no Abdominal pain. no Appetite change. no Change in bowel movements.  FEMALE REPRODUCTIVE:  no Abnormal vaginal discharge. no Breast lumps or discharge. no Breast pain. no Unusual vaginal discharge. no Vaginal irritation. no Vaginal itching. no Vaginal spotting.  NEUROLOGY:  no Dizziness. no Headache. no Loss of consciousness.  SKIN:  no Rash. no Hives.  HEMATOLOGY/LYMPH:  no Anemia. no Fatigue. Using Blood Thinners no.     Performed in office: Examination  General Examination: GENERAL APPEARANCE well developed, well nourished .  SKIN: warm and dry, no rashes .  NECK: supple, normal appearance .  LUNGS: regular breathing rate and effort .  FEMALE GENITOURINARY: normal external genitalia, labia - unremarkable, vagina - pink moist mucosa, no lesions  or abnormal discharge, cervix - no discharge or lesions or CMT, adnexa - no masses or tenderness, uterus - nontender and normal size on palpation.  EXTREMITIES: no edema present .  PSYCH appropriate mood and affect .     A/P: 55yo female who presents for hysteroscopy, D&C, polypectomy due to uterine polyp  -NPO -LR @ 125cc/hr -SCDs to OR -Risk/benefits and indications reviewed including risk of bleeding,  infection and injury to surrounding organs/uterine perforation.  Questions and concerns were addressed and pt wishes to proceed.  Janyth Pupa, DO [phone] (pager) [phone] (office)

## 2015-07-27 ENCOUNTER — Encounter (HOSPITAL_COMMUNITY)
Admission: RE | Disposition: A | Discharge status: Home / Self Care | Payer: Self-pay | Source: Ambulatory Visit | Attending: Obstetrics & Gynecology

## 2015-07-27 ENCOUNTER — Ambulatory Visit (HOSPITAL_COMMUNITY): Payer: BLUE CROSS/BLUE SHIELD | Admitting: Anesthesiology

## 2015-07-27 ENCOUNTER — Ambulatory Visit (HOSPITAL_COMMUNITY)
Admission: RE | Admit: 2015-07-27 | Discharge: 2015-07-27 | Disposition: A | Discharge status: Home / Self Care | Payer: BLUE CROSS/BLUE SHIELD | Source: Ambulatory Visit | Attending: Obstetrics & Gynecology | Admitting: Obstetrics & Gynecology

## 2015-07-27 ENCOUNTER — Encounter (HOSPITAL_COMMUNITY): Payer: Self-pay | Admitting: Anesthesiology

## 2015-07-27 DIAGNOSIS — R938 Abnormal findings on diagnostic imaging of other specified body structures: Secondary | ICD-10-CM | POA: Insufficient documentation

## 2015-07-27 DIAGNOSIS — Z6841 Body Mass Index (BMI) 40.0 and over, adult: Secondary | ICD-10-CM | POA: Insufficient documentation

## 2015-07-27 DIAGNOSIS — N840 Polyp of corpus uteri: Secondary | ICD-10-CM | POA: Diagnosis present

## 2015-07-27 DIAGNOSIS — Z87891 Personal history of nicotine dependence: Secondary | ICD-10-CM | POA: Insufficient documentation

## 2015-07-27 DIAGNOSIS — N941 Unspecified dyspareunia: Secondary | ICD-10-CM | POA: Diagnosis not present

## 2015-07-27 HISTORY — DX: Other skin changes: R23.8

## 2015-07-27 HISTORY — DX: Nausea with vomiting, unspecified: R11.2

## 2015-07-27 HISTORY — DX: Malignant (primary) neoplasm, unspecified: C80.1

## 2015-07-27 HISTORY — DX: Other specified postprocedural states: Z98.890

## 2015-07-27 HISTORY — PX: DILATATION & CURETTAGE/HYSTEROSCOPY WITH MYOSURE: SHX6511

## 2015-07-27 SURGERY — DILATATION & CURETTAGE/HYSTEROSCOPY WITH MYOSURE
Anesthesia: General

## 2015-07-27 MED ORDER — DEXAMETHASONE SODIUM PHOSPHATE 10 MG/ML IJ SOLN
INTRAMUSCULAR | Status: DC | PRN
  Administered 2015-07-27: 4 mg via INTRAVENOUS

## 2015-07-27 MED ORDER — PROPOFOL 10 MG/ML IV BOLUS
INTRAVENOUS | Status: DC | PRN
Start: 2015-07-27 — End: 2015-07-27
  Administered 2015-07-27: 180 mg via INTRAVENOUS

## 2015-07-27 MED ORDER — FENTANYL CITRATE (PF) 100 MCG/2ML IJ SOLN
INTRAMUSCULAR | Status: DC | PRN
  Administered 2015-07-27 (×2): 50 ug via INTRAVENOUS

## 2015-07-27 MED ORDER — KETOROLAC TROMETHAMINE 30 MG/ML IJ SOLN
INTRAMUSCULAR | Status: AC
  Filled 2015-07-27: qty 1

## 2015-07-27 MED ORDER — SODIUM CHLORIDE 0.9 % IJ SOLN
INTRAMUSCULAR | Status: AC
  Filled 2015-07-27: qty 10

## 2015-07-27 MED ORDER — LACTATED RINGERS IV SOLN: INTRAVENOUS | Status: DC

## 2015-07-27 MED ORDER — FENTANYL CITRATE (PF) 100 MCG/2ML IJ SOLN: 25.0000 ug | INTRAMUSCULAR | Status: DC | PRN

## 2015-07-27 MED ORDER — SCOPOLAMINE 1 MG/3DAYS TD PT72
1.0000 | MEDICATED_PATCH | Freq: Once | TRANSDERMAL | Status: DC
  Administered 2015-07-27: 1.5 mg via TRANSDERMAL

## 2015-07-27 MED ORDER — ONDANSETRON HCL 4 MG/2ML IJ SOLN
INTRAMUSCULAR | Status: DC | PRN
  Administered 2015-07-27: 4 mg via INTRAVENOUS

## 2015-07-27 MED ORDER — SCOPOLAMINE 1 MG/3DAYS TD PT72
MEDICATED_PATCH | TRANSDERMAL | Status: AC
  Administered 2015-07-27: 1.5 mg via TRANSDERMAL
  Filled 2015-07-27: qty 1

## 2015-07-27 MED ORDER — DEXAMETHASONE SODIUM PHOSPHATE 4 MG/ML IJ SOLN
INTRAMUSCULAR | Status: AC
  Filled 2015-07-27: qty 1

## 2015-07-27 MED ORDER — FENTANYL CITRATE (PF) 250 MCG/5ML IJ SOLN
INTRAMUSCULAR | Status: AC
  Filled 2015-07-27: qty 5

## 2015-07-27 MED ORDER — PROPOFOL 10 MG/ML IV BOLUS
INTRAVENOUS | Status: AC
  Filled 2015-07-27: qty 20

## 2015-07-27 MED ORDER — LACTATED RINGERS IV SOLN
INTRAVENOUS | Status: DC
  Administered 2015-07-27: 08:00:00 via INTRAVENOUS

## 2015-07-27 MED ORDER — DIPHENHYDRAMINE HCL 50 MG/ML IJ SOLN
INTRAMUSCULAR | Status: DC | PRN
  Administered 2015-07-27: 25 mg via INTRAVENOUS

## 2015-07-27 MED ORDER — LIDOCAINE HCL (CARDIAC) 20 MG/ML IV SOLN
INTRAVENOUS | Status: AC
  Filled 2015-07-27: qty 5

## 2015-07-27 MED ORDER — SILVER NITRATE-POT NITRATE 75-25 % EX MISC
CUTANEOUS | Status: AC
  Filled 2015-07-27: qty 1

## 2015-07-27 MED ORDER — ONDANSETRON HCL 4 MG/2ML IJ SOLN
INTRAMUSCULAR | Status: AC
  Filled 2015-07-27: qty 2

## 2015-07-27 MED ORDER — LIDOCAINE HCL (CARDIAC) 20 MG/ML IV SOLN
INTRAVENOUS | Status: DC | PRN
  Administered 2015-07-27: 30 mg via INTRAVENOUS

## 2015-07-27 MED ORDER — KETOROLAC TROMETHAMINE 30 MG/ML IJ SOLN
INTRAMUSCULAR | Status: DC | PRN
  Administered 2015-07-27: 30 mg via INTRAVENOUS

## 2015-07-27 MED ORDER — PROCHLORPERAZINE EDISYLATE 5 MG/ML IJ SOLN: 10.0000 mg | Freq: Once | INTRAMUSCULAR | Status: DC

## 2015-07-27 SURGICAL SUPPLY — 17 items
CANISTERS HI-FLOW 3000CC (CANNISTER) ×3 IMPLANT
CATH ROBINSON RED A/P 16FR (CATHETERS) ×3 IMPLANT
CLOTH BEACON ORANGE TIMEOUT ST (SAFETY) ×3 IMPLANT
CONTAINER PREFILL 10% NBF 60ML (FORM) ×6 IMPLANT
DEVICE MYOSURE LITE (MISCELLANEOUS) IMPLANT
DEVICE MYOSURE REACH (MISCELLANEOUS) IMPLANT
DILATOR CANAL MILEX (MISCELLANEOUS) IMPLANT
GLOVE BIOGEL PI IND STRL 6.5 (GLOVE) ×2 IMPLANT
GLOVE BIOGEL PI IND STRL 7.0 (GLOVE) ×1 IMPLANT
GLOVE BIOGEL PI INDICATOR 6.5 (GLOVE) ×4
GLOVE BIOGEL PI INDICATOR 7.0 (GLOVE) ×2
GLOVE ECLIPSE 6.5 STRL STRAW (GLOVE) ×3 IMPLANT
GOWN STRL REUS W/TWL LRG LVL3 (GOWN DISPOSABLE) ×6 IMPLANT
PACK VAGINAL MINOR WOMEN LF (CUSTOM PROCEDURE TRAY) ×3 IMPLANT
PAD OB MATERNITY 4.3X12.25 (PERSONAL CARE ITEMS) ×3 IMPLANT
SEAL ROD LENS SCOPE MYOSURE (ABLATOR) ×3 IMPLANT
TOWEL OR 17X24 6PK STRL BLUE (TOWEL DISPOSABLE) ×6 IMPLANT

## 2015-07-27 NOTE — Transfer of Care (Signed)
Immediate Anesthesia Transfer of Care Note  Patient: Regina Lindsey  Procedure(s) Performed: Procedure(s) with comments: DILATATION & CURETTAGE/HYSTEROSCOPY WITH MYOSURE (N/A) - Polypectomy  Patient Location: PACU  Anesthesia Type:General  Level of Consciousness: awake, alert , oriented and patient cooperative  Airway & Oxygen Therapy: Patient Spontanous Breathing and Patient connected to nasal cannula oxygen  Post-op Assessment: Report given to RN and Post -op Vital signs reviewed and stable  Post vital signs: Reviewed and stable  Last Vitals:  Filed Vitals:   07/27/15 0750  BP: 160/84  Pulse: 85  Temp: 36.8 C  Resp: 16    Last Pain: There were no vitals filed for this visit.    Patients Stated Pain Goal: 5 ([id number] [phone])  Complications: No apparent anesthesia complications

## 2015-07-27 NOTE — Interval H&P Note (Signed)
History and Physical Interval Note:  07/27/2015 8:41 AM  Regina Lindsey  has presented today for surgery, with the diagnosis of R93.8 Thickened Endometrium   The various methods of treatment have been discussed with the patient and family. After consideration of risks, benefits and other options for treatment, the patient has consented to  Procedure(s) with comments: [place] (N/A) - Polypectomy as a surgical intervention .  The patient's history has been reviewed, patient examined, no change in status, stable for surgery.  I have reviewed the patient's chart and labs.  Questions were answered to the patient's satisfaction.     Janyth Pupa, M

## 2015-07-27 NOTE — Anesthesia Procedure Notes (Signed)
Procedure Name: LMA Insertion Date/Time: 07/27/2015 9:00 AM Performed by: Tobin Chad Pre-anesthesia Checklist: Patient identified, Patient being monitored, Emergency Drugs available, Timeout performed and Suction available Patient Re-evaluated:Patient Re-evaluated prior to inductionOxygen Delivery Method: Circle system utilized and Simple face mask Preoxygenation: Pre-oxygenation with 100% oxygen Intubation Type: IV induction Ventilation: Mask ventilation without difficulty LMA: LMA with gastric port inserted LMA Size: 4.0 Grade View: Grade II Number of attempts: 1 Placement Confirmation: breath sounds checked- equal and bilateral and positive ETCO2 Dental Injury: Teeth and Oropharynx as per pre-operative assessment

## 2015-07-27 NOTE — Anesthesia Preprocedure Evaluation (Signed)
Anesthesia Evaluation  Patient identified by MRN, date of birth, ID band Patient awake    Reviewed: Allergy & Precautions, NPO status , Patient's Chart, lab work & pertinent test results  Airway Mallampati: II  TM Distance: >3 FB Neck ROM: Full    Dental no notable dental hx.    Pulmonary neg pulmonary ROS, former smoker,    Pulmonary exam normal breath sounds clear to auscultation       Cardiovascular negative cardio ROS Normal cardiovascular exam Rhythm:Regular Rate:Normal     Neuro/Psych negative neurological ROS  negative psych ROS   GI/Hepatic negative GI ROS, Neg liver ROS,   Endo/Other  Morbid obesity  Renal/GU negative Renal ROS  negative genitourinary   Musculoskeletal negative musculoskeletal ROS (+)   Abdominal (+) + obese,   Peds negative pediatric ROS (+)  Hematology negative hematology ROS (+)   Anesthesia Other Findings   Reproductive/Obstetrics negative OB ROS                             Anesthesia Physical Anesthesia Plan  ASA: II  Anesthesia Plan: General   Post-op Pain Management:    Induction: Intravenous  Airway Management Planned: Oral ETT and LMA  Additional Equipment:   Intra-op Plan:   Post-operative Plan: Extubation in OR  Informed Consent: I have reviewed the patients History and Physical, chart, labs and discussed the procedure including the risks, benefits and alternatives for the proposed anesthesia with the patient or authorized representative who has indicated his/her understanding and acceptance.   Dental advisory given  Plan Discussed with: CRNA and Surgeon  Anesthesia Plan Comments:         Anesthesia Quick Evaluation

## 2015-07-27 NOTE — Op Note (Signed)
Operative Report  PreOp: Uterine polyp PostOp: same Procedure:  Hysteroscopy, Dilation and Curettage, myosure polypectomy Surgeon: Dr. Janyth Pupa Anesthesia: General Complications:none EBL: 75mL UOP: 50cc IVF:400cc Discrepancy: 70cc  Findings: 6cm anteverted uterus with 2cm uterine polyp, both ostia visualized  Specimens: 1) endometrial polyp  Procedure: The patient was taken to the operating room where she underwent general anesthesia without difficulty. The patient was placed in a low lithotomy position using Allen stirrups. The patient was examined with the findings as noted above.  She was then prepped and draped in the normal sterile fashion. The bladder was drained using a red rubber urethral catheter. A sterile speculum was inserted into the vagina. A single tooth tenaculum was placed on the anterior lip of the cervix. The uterus was then sounded to 6cm. The endocervical canal was then serially dilated to 16French using Hank dilators.  The diagnostic hysteroscope was then inserted without difficulty and noted to have the findings as listed above. The myosure device was inserted and resection of the polyp was performed.  Visualization was achieved using NS as a distending medium. The tissue was sent to pathology. The uterine cavity was visualized, no uterine perforations were seen. All instrument were then removed. Hemostasis was observed at the cervical site. The patient was repositioned to the supine position. The patient tolerated the procedure without any complications and taken to recovery in stable condition.   Janyth Pupa, DO [phone] (pager) [phone] (office)

## 2015-07-27 NOTE — Anesthesia Postprocedure Evaluation (Signed)
Anesthesia Post Note  Patient: SHERLEAN SALASAR  Procedure(s) Performed: Procedure(s) (LRB): DILATATION & CURETTAGE/HYSTEROSCOPY WITH MYOSURE (N/A)  Patient location during evaluation: PACU Anesthesia Type: General Level of consciousness: awake and alert Pain management: pain level controlled Vital Signs Assessment: post-procedure vital signs reviewed and stable Respiratory status: spontaneous breathing, nonlabored ventilation, respiratory function stable and patient connected to nasal cannula oxygen Cardiovascular status: blood pressure returned to baseline and stable Postop Assessment: no signs of nausea or vomiting Anesthetic complications: no    Last Vitals:  Filed Vitals:   07/27/15 0930 07/27/15 0945  BP: 149/84 127/69  Pulse: 65 71  Temp:    Resp: 18 14    Last Pain: There were no vitals filed for this visit.               Holley Wirt S

## 2015-07-27 NOTE — Discharge Instructions (Addendum)
HOME INSTRUCTIONS  Please note any unusual or excessive bleeding, pain, swelling. Mild dizziness or drowsiness are normal for about 24 hours after surgery.   Shower when comfortable  Restrictions: No driving for 24 hours or while taking pain medications.  Activity:  No heavy lifting (> 20 lbs), nothing in vagina (no tampons, douching, or intercourse) x 2 weeks; no tub baths for 2 weeks Vaginal spotting is expected but if your bleeding is heavy, period like,  please call the office    Diet:  You may return to your regular diet.  Do not eat large meals.  Eat small frequent meals throughout the day.  Continue to drink a good amount of water at least 6-8 glasses of water per day, hydration is very important for the healing process.  Pain Management: Take Motrin and/or tylenol as needed for pain.  Always take prescription pain medication with food, it may cause constipation, increase fluids and fiber and you may want to take an over-the-counter stool softener like Colace as needed up to 2x a day.    Alcohol -- Avoid for 24 hours and while taking pain medications.  Nausea: Take sips of ginger ale or soda  Fever -- Call physician if temperature over 101 degrees  Follow up:  If you do not already have a follow up appointment scheduled, please call the office at [phone].  If you experience fever (a temperature greater than 100.4), pain unrelieved by pain medication, shortness of breath, swelling of a single leg, or any other symptoms which are concerning to you please the office immediately.  Do not take ibuprofen/motrin/advil until 3:15 07/27/15.  You may remove the patch behind your ear on or before 07/30/15.  Wash your hands with soap and water after contact with the patch.  Recovery Unit [phone]

## 2015-07-28 ENCOUNTER — Encounter (HOSPITAL_COMMUNITY): Payer: Self-pay | Admitting: Obstetrics & Gynecology

## 2015-08-14 ENCOUNTER — Other Ambulatory Visit: Payer: Self-pay | Admitting: Advanced Practice Midwife

## 2015-08-14 DIAGNOSIS — Z1231 Encounter for screening mammogram for malignant neoplasm of breast: Secondary | ICD-10-CM

## 2015-08-21 ENCOUNTER — Other Ambulatory Visit: Payer: Self-pay | Admitting: Family Medicine

## 2015-08-21 ENCOUNTER — Other Ambulatory Visit: Payer: Self-pay | Admitting: Advanced Practice Midwife

## 2015-08-21 DIAGNOSIS — R1032 Left lower quadrant pain: Secondary | ICD-10-CM

## 2015-08-23 ENCOUNTER — Other Ambulatory Visit: Payer: BLUE CROSS/BLUE SHIELD

## 2016-09-04 ENCOUNTER — Other Ambulatory Visit: Payer: Self-pay | Admitting: Family Medicine

## 2016-09-04 ENCOUNTER — Other Ambulatory Visit (HOSPITAL_COMMUNITY)
Admission: RE | Admit: 2016-09-04 | Discharge: 2016-09-04 | Disposition: A | Discharge status: Home / Self Care | Payer: PRIVATE HEALTH INSURANCE | Source: Ambulatory Visit | Attending: Family Medicine | Admitting: Family Medicine

## 2016-09-04 DIAGNOSIS — Z124 Encounter for screening for malignant neoplasm of cervix: Secondary | ICD-10-CM | POA: Diagnosis present

## 2016-09-04 DIAGNOSIS — Z1231 Encounter for screening mammogram for malignant neoplasm of breast: Secondary | ICD-10-CM

## 2016-09-07 LAB — CYTOLOGY - PAP
DIAGNOSIS: NEGATIVE
HPV (WINDOPATH): NOT DETECTED

## 2016-09-25 ENCOUNTER — Ambulatory Visit: Payer: BLUE CROSS/BLUE SHIELD

## 2017-02-27 ENCOUNTER — Other Ambulatory Visit: Payer: Self-pay | Admitting: Family Medicine

## 2017-02-27 DIAGNOSIS — Z1231 Encounter for screening mammogram for malignant neoplasm of breast: Secondary | ICD-10-CM

## 2017-03-31 ENCOUNTER — Ambulatory Visit
Admission: RE | Admit: 2017-03-31 | Discharge: 2017-03-31 | Disposition: A | Discharge status: Home / Self Care | Payer: PRIVATE HEALTH INSURANCE | Source: Ambulatory Visit | Attending: Family Medicine | Admitting: Family Medicine

## 2017-03-31 DIAGNOSIS — Z1231 Encounter for screening mammogram for malignant neoplasm of breast: Secondary | ICD-10-CM

## 2017-11-21 ENCOUNTER — Encounter (HOSPITAL_COMMUNITY): Payer: PRIVATE HEALTH INSURANCE

## 2017-11-21 ENCOUNTER — Ambulatory Visit (HOSPITAL_BASED_OUTPATIENT_CLINIC_OR_DEPARTMENT_OTHER)
Admission: RE | Admit: 2017-11-21 | Discharge: 2017-11-21 | Disposition: A | Discharge status: Home / Self Care | Payer: PRIVATE HEALTH INSURANCE | Source: Ambulatory Visit | Attending: *Deleted | Admitting: *Deleted

## 2017-11-21 ENCOUNTER — Other Ambulatory Visit (HOSPITAL_BASED_OUTPATIENT_CLINIC_OR_DEPARTMENT_OTHER): Payer: Self-pay | Admitting: *Deleted

## 2017-11-21 ENCOUNTER — Other Ambulatory Visit: Payer: Self-pay

## 2017-11-21 DIAGNOSIS — R600 Localized edema: Secondary | ICD-10-CM | POA: Diagnosis present

## 2017-11-21 DIAGNOSIS — M79605 Pain in left leg: Secondary | ICD-10-CM

## 2017-11-21 DIAGNOSIS — I824Z2 Acute embolism and thrombosis of unspecified deep veins of left distal lower extremity: Secondary | ICD-10-CM

## 2018-03-31 ENCOUNTER — Other Ambulatory Visit: Payer: Self-pay | Admitting: Family Medicine

## 2018-03-31 DIAGNOSIS — Z1231 Encounter for screening mammogram for malignant neoplasm of breast: Secondary | ICD-10-CM

## 2018-05-04 ENCOUNTER — Ambulatory Visit: Payer: PRIVATE HEALTH INSURANCE

## 2018-10-20 ENCOUNTER — Ambulatory Visit: Payer: PRIVATE HEALTH INSURANCE

## 2018-12-01 ENCOUNTER — Ambulatory Visit
Admission: RE | Admit: 2018-12-01 | Discharge: 2018-12-01 | Disposition: A | Discharge status: Home / Self Care | Payer: PRIVATE HEALTH INSURANCE | Source: Ambulatory Visit | Attending: Family Medicine | Admitting: Family Medicine

## 2018-12-01 ENCOUNTER — Other Ambulatory Visit: Payer: Self-pay

## 2018-12-01 DIAGNOSIS — Z1231 Encounter for screening mammogram for malignant neoplasm of breast: Secondary | ICD-10-CM

## 2019-10-29 ENCOUNTER — Other Ambulatory Visit: Payer: Self-pay | Admitting: Family Medicine

## 2019-10-29 DIAGNOSIS — Z1231 Encounter for screening mammogram for malignant neoplasm of breast: Secondary | ICD-10-CM

## 2019-12-02 ENCOUNTER — Ambulatory Visit: Payer: PRIVATE HEALTH INSURANCE

## 2019-12-25 ENCOUNTER — Other Ambulatory Visit: Payer: Self-pay

## 2019-12-25 ENCOUNTER — Ambulatory Visit: Payer: PRIVATE HEALTH INSURANCE | Attending: Internal Medicine

## 2019-12-25 DIAGNOSIS — Z23 Encounter for immunization: Secondary | ICD-10-CM

## 2019-12-25 NOTE — Progress Notes (Signed)
   Covid-19 Vaccination Clinic  Name:  Regina Lindsey    MRN: [id number] DOB: [birth year]  12/25/2019  [name] was observed post Covid-19 immunization for 15 minutes without incident. She was provided with Vaccine Information Sheet and instruction to access the V-Safe system.   [name] was instructed to call 911 with any severe reactions post vaccine: Marland Kitchen Difficulty breathing  . Swelling of face and throat  . A fast heartbeat  . A bad rash all over body  . Dizziness and weakness

## 2020-03-07 ENCOUNTER — Ambulatory Visit: Payer: PRIVATE HEALTH INSURANCE

## 2020-04-10 ENCOUNTER — Ambulatory Visit (INDEPENDENT_AMBULATORY_CARE_PROVIDER_SITE_OTHER): Payer: PRIVATE HEALTH INSURANCE | Admitting: Cardiology

## 2020-04-10 ENCOUNTER — Other Ambulatory Visit: Payer: Self-pay

## 2020-04-10 ENCOUNTER — Encounter: Payer: Self-pay | Admitting: Cardiology

## 2020-04-10 VITALS — BP 124/90 | HR 85 | Ht 61.0 in | Wt 251.0 lb

## 2020-04-10 DIAGNOSIS — Z8249 Family history of ischemic heart disease and other diseases of the circulatory system: Secondary | ICD-10-CM

## 2020-04-10 DIAGNOSIS — R9431 Abnormal electrocardiogram [ECG] [EKG]: Secondary | ICD-10-CM | POA: Diagnosis not present

## 2020-04-10 DIAGNOSIS — R0789 Other chest pain: Secondary | ICD-10-CM

## 2020-04-10 DIAGNOSIS — E7849 Other hyperlipidemia: Secondary | ICD-10-CM

## 2020-04-10 DIAGNOSIS — R079 Chest pain, unspecified: Secondary | ICD-10-CM

## 2020-04-10 MED ORDER — METOPROLOL TARTRATE 50 MG PO TABS: 100.0000 mg | ORAL_TABLET | Freq: Once | ORAL | 0 refills | Status: DC

## 2020-04-10 NOTE — Progress Notes (Signed)
Primary Care Provider: Mayra Neer, MD Cardiologist: Glenetta Hew, MD Electrophysiologist: None  Clinic Note: Chief Complaint  Patient presents with  . New Patient (Initial Visit)  . Chest Pain   ===================================  ASSESSMENT/PLAN   Problem List Items Addressed This Visit    Family history of premature CAD (Chronic)    She has been notable family history for CAD-half brother had an MI at 33, and mother had MI at 26. She has obesity and hyperlipidemia.  I do not see hemoglobin A1c but glucose is elevated. (Obesity, hypertension, TG greater >150, glucose intolerance with fasting glucose of 607) = metabolic syndrome -> this does put her at higher risk for cardiovascular disease  She has pretty significant hyperlipidemia and is very sedentary.  She does not think she can walk on treadmill.  My consideration initially was to do a GXT, but she could not walk on the treadmill.  As such, I think the best course of action will be to do a coronary CTA which will give [place] a calcium score results as well as the endoscopic evaluation with the CT scan angiogram as well as the potential for FFR as a stress test equivalent.  Plan: Coronary CTA-FFR -> this includes the coronary calcium score and anatomic evaluation with angiogram-which is beyond the capacity of a nuclear stress test..  We will probably need to address her hyperlipidemia with a statin and possibly more  We will defer prediabetes evaluation to PCP.      Relevant Orders   CT CORONARY MORPH W/CTA COR W/SCORE W/CA W/CM &/OR WO/CM   CT CORONARY FRACTIONAL FLOW RESERVE DATA PREP   CT CORONARY FRACTIONAL FLOW RESERVE FLUID ANALYSIS   Basic metabolic panel   Hyperlipidemia due to dietary fat intake (Chronic)    Very out-of-control hyperlipidemia => total cholesterol 269 with an LDL of 191.  Depending on upcoming results, would probably need to be very aggressive in treating.  She is not currently on any  medications, I suspect she will need to be on something.  With a family history, which should for an LDL of at least < 100 given her other risk factors.  Pending ischemic evaluation, would potentially consider eating even more aggressive.      Relevant Orders   CT CORONARY MORPH W/CTA COR W/SCORE W/CA W/CM &/OR WO/CM   CT CORONARY FRACTIONAL FLOW RESERVE DATA PREP   CT CORONARY FRACTIONAL FLOW RESERVE FLUID ANALYSIS   Chest pain of uncertain etiology - Primary    Strange location for chest discomfort.  She isolates it in almost the mid axillary area but that her pain is across her chest.  The symptoms are also unusual that there really is not any true association with activity.  I do think is reasonable to do an ischemic evaluation.  My initial thought was to do a GXT, but she indicated she would not do it on treadmill.  Her body habitus will make a Myoview less than favorable, and I chose coronary CTA which gives [place] both coronary calcium score and coronary anatomy data that is more telling them a Myoview stress test would be.  If there is a lesion, it can be evaluated with CT FFR.  With this data, even if there is no suggestion of ischemic CAD, we can tailor therapy risk factor modification based on risk stratification.  Plan: Coronary CTA-FFR      Relevant Orders   EKG 12-Lead (Completed)   EXERCISE TOLERANCE TEST (ETT)   CT  CORONARY MORPH W/CTA COR W/SCORE W/CA W/CM &/OR WO/CM   CT CORONARY FRACTIONAL FLOW RESERVE DATA PREP   CT CORONARY FRACTIONAL FLOW RESERVE FLUID ANALYSIS   Basic metabolic panel   Nonspecific abnormal electrocardiogram (ECG) (EKG)    There is suggestion of possible prior infarct which is probably more related to body habitus.   Difficult to know for sure.  We are evaluating chest discomfort with a coronary CTA      Relevant Orders   CT CORONARY MORPH W/CTA COR W/SCORE W/CA W/CM &/OR WO/CM   CT CORONARY FRACTIONAL FLOW RESERVE DATA PREP   CT CORONARY  FRACTIONAL FLOW RESERVE FLUID ANALYSIS   Basic metabolic panel    Other Visit Diagnoses    Other chest pain       Relevant Orders   CT CORONARY MORPH W/CTA COR W/SCORE W/CA W/CM &/OR WO/CM   CT CORONARY FRACTIONAL FLOW RESERVE DATA PREP   CT CORONARY FRACTIONAL FLOW RESERVE FLUID ANALYSIS     ===================================  HPI:    Regina Lindsey is a 60 y.o. female with a PMH below who is being seen today for the evaluation of CHEST PAIN at the request of Mayra Neer, MD.  Earle Gell was last seen on 02/04/2021 -> she noted symptoms beginning back on February 8 -> started his left elbow pain that then radiated to the left shoulder and eventually into the left chest.  Did not radiate to the jaw.  No dyspnea.  Constant pain ->), worsened with exertion.  Was noted to have hypertension in the 160/98 mmHg range manually at work.  Indicates significant work stress. => Labs ordered (CBC, TSH and CMP-results monitoring).  EKG suggested left anterior fascicular block.  Recent Hospitalizations: None  Reviewed  CV studies:    The following studies were reviewed today: (if available, images/films reviewed: From Epic Chart or Care Everywhere) . None:   Interval History:   ALLIAH BOULANGER presents today with a complaint as noted above with a visit with Dr. Brigitte Pulse.  She said on Friday,(February 8) she started having some left elbow pain and then eventually radiated to her left shoulder.  She did start having some left-sided chest discomfort which seems to come and go.  She describes the discomfort almost in the left axillary region.  Somewhat tender to palpation but not necessarily.  He is not in the midline or parasternal.  Pain can get up to maybe about 6/10, and will come and go.  Dr. Trena Platt note indicated that she had mentioned this make it worse with exertion, she is not sure if it really does or not.  She said clearly can happen both at rest and with exertion.  She really cannot  think of anything that may be a particular trigger.  She has not really associate any shortness of breath, but just a sense of unease.  She may feel a little quivering in her chest, but not really palpitations.  Since the initial episode, obese helping somewhat on the goalie.  She also notes that she been under quite a bit of stress at work, as well as at home.  She is somewhat concerned because of her family history.  She has been doing a lot of anxiety.  She is not sure if these episodes are anxiety related or they are anxiety provoking.   CV Review of Symptoms (Summary) Cardiovascular ROS: positive for - chest pain and A sense of unease negative for - dyspnea on exertion, edema, irregular  heartbeat, orthopnea, palpitations, paroxysmal nocturnal dyspnea, rapid heart rate, shortness of breath or Lightheadedness, dizziness or syncope as near syncope, TIA/amaurosis fugax, claudication  The patient does not have symptoms concerning for COVID-19 infection (fever, chills, cough, or new shortness of breath).   REVIEWED OF SYSTEMS   Review of Systems  Constitutional: Negative for malaise/fatigue and weight loss.  HENT: Negative for nosebleeds and sore throat.   Respiratory: Negative for cough and shortness of breath.   Cardiovascular: Negative for claudication.       Per HPI  Gastrointestinal: Negative for abdominal pain and melena.  Genitourinary: Negative for frequency and hematuria.  Musculoskeletal: Positive for back pain and joint pain (Her knees  -> probably could not do a treadmill; left elbow pain as noted). Negative for falls.  Neurological: Negative for dizziness, focal weakness and weakness.       Balance is off a little bit  Psychiatric/Behavioral: Negative for depression and memory loss. The patient is nervous/anxious. The patient does not have insomnia.     I have reviewed and (if needed) personally updated the patient's problem list, medications, allergies, past medical and  surgical history, social and family history.   PAST MEDICAL HISTORY   Past Medical History:  Diagnosis Date  . 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 Complication of anesthesia    hard to wake up  . Contact lens/glasses fitting    wears contacts or glasses  . Headache(784.0)    Migraines  . Hyperlipidemia   . MDD (recurrent major depressive disorder) in remission ([place])   . Morbid obesity with BMI of 45.0-49.9, adult ([place]) 04/13/2020   BMI 47.43  . Overactive bladder   . PONV (postoperative nausea and vomiting)     PAST SURGICAL HISTORY   Past Surgical History:  Procedure Laterality Date  . CARPAL TUNNEL RELEASE  2004   right  . CHOLECYSTECTOMY    . DILATATION & CURETTAGE/HYSTEROSCOPY WITH MYOSURE N/A 07/27/2015   Procedure: DILATATION & CURETTAGE/HYSTEROSCOPY WITH MYOSURE;  Surgeon: Janyth Pupa, DO;  Location: [facility];  Service: Gynecology;  Laterality: N/A;  Polypectomy  . KNEE ARTHROSCOPY WITH LATERAL MENISECTOMY  03/27/2012   Procedure: KNEE ARTHROSCOPY WITH LATERAL MENISECTOMY;  Surgeon: Johnny Bridge, MD;  Location: [facility];  Service: Orthopedics;  Laterality: Left;  left knee arthroscopy with partial lateral menisectomy  . ORIF ANKLE FRACTURE  1980   right  . SHOULDER ARTHROSCOPY  2001   right  . TONSILLECTOMY AND ADENOIDECTOMY  1980   age 47    Immunization History  Administered Date(s) Administered  . Influenza-Unspecified 12/13/2019  . PFIZER(Purple Top)SARS-COV-2 Vaccination 03/17/2019, 04/07/2019, 12/25/2019    MEDICATIONS/ALLERGIES   Current Meds  Medication Sig  . Ascorbic Acid (VITAMIN C PO) Take 1 capsule by mouth daily.  . cyclobenzaprine (FLEXERIL) 10 MG tablet Take 10 mg by mouth 3 (three) times daily as needed for muscle spasms.   Marland Kitchen ibuprofen (ADVIL,MOTRIN) 800 MG tablet Take 800 mg by mouth every 8 (eight) hours as needed for moderate pain.   . metoprolol tartrate (LOPRESSOR) 50 MG tablet  Take 2 tablets (100 mg total) by mouth once for 1 dose. TAKE TWO HOURS PRIOR TO  SCHEDULE CARDIAC TEST  . Multiple Vitamins-Minerals (ZINC PO) Take 1 tablet by mouth daily.  . RELPAX 40 MG tablet Take 40 mg by mouth every 2 (two) hours as needed for migraine (may repeat after 2 hours if headache/migraine returns).   Marland Kitchen  VITAMIN D, CHOLECALCIFEROL, PO Take 1 capsule by mouth daily.    Allergies  Allergen Reactions  . Celebrex [Celecoxib] Anaphylaxis  . Penicillins Anaphylaxis    Has patient had a PCN reaction causing immediate rash, facial/tongue/throat swelling, SOB or lightheadedness with hypotension: Yes Has patient had a PCN reaction causing severe rash involving mucus membranes or skin necrosis: Yes Has patient had a PCN reaction that required hospitalization Yes Has patient had a PCN reaction occurring within the last 10 years: No If all of the above answers are "NO", then may proceed with Cephalosporin use.   . Sulfa Antibiotics Anaphylaxis  . Codeine Nausea And Vomiting  . Versed [Midazolam]     Opposite effect-hyper-mean  . Neomycin Rash    SOCIAL HISTORY/FAMILY HISTORY   Reviewed in Epic:  Pertinent findings:  Social History   Tobacco Use  . Smoking status: Former Smoker    Types: Cigarettes    Quit date: 03/23/2005    Years since quitting: 15.0  . Smokeless tobacco: Never Used  Substance Use Topics  . Alcohol use: No  . Drug use: No   Social History   Social History Narrative   She admittedly does not exercise much she should.   She works 10 to 12-hour days and is exhausted at the end of work.  Unfortunately, she does not exercise on her nonwork days.    OBJCTIVE -PE, EKG, labs   Wt Readings from Last 3 Encounters:  04/10/20 251 lb (113.9 kg)  07/27/15 241 lb (109.3 kg)  03/27/12 245 lb 6 oz (111.3 kg)    Physical Exam: BP 124/90   Pulse 85   Ht $R'5\' 1"'[place]$  (1.549 m)   Wt 251 lb (113.9 kg)   BMI 47.43 kg/m  Physical Exam Vitals reviewed.   Constitutional:      General: She is not in acute distress.    Appearance: Normal appearance. She is obese. She is not ill-appearing or toxic-appearing.     Comments: Morbidly obese; well-groomed  HENT:     Head: Normocephalic and atraumatic.  Eyes:     Extraocular Movements: Extraocular movements intact.     Pupils: Pupils are equal, round, and reactive to light.  Neck:     Vascular: No carotid bruit, hepatojugular reflux or JVD.  Cardiovascular:     Rate and Rhythm: Normal rate and regular rhythm.  No extrasystoles are present.    Chest Wall: PMI is not displaced (Unable to palpate).     Pulses: Intact distal pulses. No decreased pulses (Decreased mostly because of body habitus).     Heart sounds: S1 normal and S2 normal. Heart sounds are distant. No murmur heard. No friction rub. No gallop.   Pulmonary:     Effort: Pulmonary effort is normal. No respiratory distress.     Breath sounds: Normal breath sounds.  Chest:     Chest wall: Tenderness (Mild tenderness palpation on the left sternal border) present.  Abdominal:     General: Bowel sounds are normal. There is no distension.     Palpations: Abdomen is soft. There is no mass.     Tenderness: There is no abdominal tenderness.     Comments: Obese, no HSM  Musculoskeletal:        General: Swelling (Trivial) present. Normal range of motion.     Cervical back: Normal range of motion and neck supple.  Neurological:     General: No focal deficit present.     Mental Status: She is alert and  oriented to person, place, and time.     Cranial Nerves: No cranial nerve deficit.     Gait: Gait normal.  Psychiatric:        Mood and Affect: Mood normal.        Behavior: Behavior normal.        Thought Content: Thought content normal.        Judgment: Judgment normal.     Adult ECG Report  Rate: 85 ;  Rhythm: normal sinus rhythm and Cannot rule out anterolateral MI, age-indeterminate.  Left axis deviation.  Otherwise normal intervals  and durations.,  Nonspecific ST and T wave changes.  Narrative Interpretation: Borderline EKG.  Recent Labs: 01/28/2019 ([facility])  Na+ 139, K+ 4.5, Cl- 100, HCO3-29, BUN 10, Cr 0.65, Glu 106, Ca2+ 9.3; AST 38, ALT 48, AlkP 98  CBC: W 9.3, H/H 15.7/47.8, Plt 251  TC 269, TG 160, HDL 48, LDL 191;   Rheumatoid Factor 12.9; ESR 5, TSH 2.42, Uric Acid 5.9, Vitamin D 8.7 (borderline low)  ==================================================  COVID-19 Education: The signs and symptoms of COVID-19 were discussed with the patient and how to seek care for testing (follow up with PCP or arrange E-visit).   The importance of social distancing and COVID-19 vaccination was discussed today. The patient is practicing social distancing & Masking.   I spent a total of 25 minutes with the patient spent in direct patient consultation.  Additional time spent with chart review  / charting (studies, outside notes, etc): 22 min Total Time: 47 min  Current medicines are reviewed at length with the patient today.  (+/- concerns) n/a  This visit occurred during the SARS-CoV-2 public health emergency.  Safety protocols were in place, including screening questions prior to the visit, additional usage of staff PPE, and extensive cleaning of exam room while observing appropriate contact time as indicated for disinfecting solutions.  Notice: This dictation was prepared with Dragon dictation along with smaller phrase technology. Any transcriptional errors that result from this process are unintentional and may not be corrected upon review.  Patient Instructions / Medication Changes & Studies & Tests Ordered   Patient Instructions  Medication Instructions:  SEE INSTRUCTION SHEET *If you need a refill on your cardiac medications before your next appointment, please call your pharmacy*   Lab Work:  PLEASE HAVE BMP   ON WEEK PRIOR TO TEST   If you have labs (blood work) drawn today and your tests are completely  normal, you will receive your results only by: Marland Kitchen MyChart Message (if you have MyChart) OR . A paper copy in the mail If you have any lab test that is abnormal or we need to change your treatment, we will call you to review the results.   Testing/Procedures: WILL BE SCHEDULE AT [facility] ONCE INSURANCE AUTHORIZATION IS GIVEN. Your physician has requested that you have cardiac CTA. Cardiac computed tomography (CT) is a painless test that uses an x-ray machine to take clear, detailed pictures of your heart.  Please follow instruction sheet as given.  --> Instructions provided  Follow-Up: At [facility] you and your health needs are our priority.  As part of our continuing mission to provide you with exceptional heart care, we have created designated Provider Care Teams.  These Care Teams include your primary Cardiologist (physician) and Advanced Practice Providers (APPs -  Physician Assistants and Nurse Practitioners) who all work together to provide you with the care you need, when you need it.  We recommend  signing up for the patient portal called "MyChart".  Sign up information is provided on this After Visit Summary.  MyChart is used to connect with patients for Virtual Visits (Telemedicine).  Patients are able to view lab/test results, encounter notes, upcoming appointments, etc.  Non-urgent messages can be sent to your provider as well.   To learn more about what you can do with MyChart, go to [email or web address].    Your next appointment:   2 month(s)  The format for your next appointment:   In Person  Provider:   Glenetta Hew, MD    Studies Ordered:   Orders Placed This Encounter  Procedures  . CT CORONARY MORPH W/CTA COR W/SCORE W/CA W/CM &/OR WO/CM  . CT CORONARY FRACTIONAL FLOW RESERVE DATA PREP  . CT CORONARY FRACTIONAL FLOW RESERVE FLUID ANALYSIS  . Basic metabolic panel  . EXERCISE TOLERANCE TEST (ETT)  . EKG 12-Lead     Glenetta Hew, M.D.,  M.S. Interventional Cardiologist   Pager # [phone] Phone # [phone] [address] [place], [place] [address]   Thank you for choosing Heartcare at [facility]!!

## 2020-04-10 NOTE — Patient Instructions (Addendum)
Medication Instructions:  SEE INSTRUCTION SHEET *If you need a refill on your cardiac medications before your next appointment, please call your pharmacy*   Lab Work:  PLEASE HAVE BMP   ON WEEK PRIOR TO TEST   If you have labs (blood work) drawn today and your tests are completely normal, you will receive your results only by: Marland Kitchen MyChart Message (if you have MyChart) OR . A paper copy in the mail If you have any lab test that is abnormal or we need to change your treatment, we will call you to review the results.   Testing/Procedures: WILL BE SCHEDULE AT [facility] ONCE INSURANCE AUTHORIZATION IS GIVEN. Your physician has requested that you have cardiac CTA. Cardiac computed tomography (CT) is a painless test that uses an x-ray machine to take clear, detailed pictures of your heart.  Please follow instruction sheet as given.      Follow-Up: At [facility], you and your health needs are our priority.  As part of our continuing mission to provide you with exceptional heart care, we have created designated Provider Care Teams.  These Care Teams include your primary Cardiologist (physician) and Advanced Practice Providers (APPs -  Physician Assistants and Nurse Practitioners) who all work together to provide you with the care you need, when you need it.  We recommend signing up for the patient portal called "MyChart".  Sign up information is provided on this After Visit Summary.  MyChart is used to connect with patients for Virtual Visits (Telemedicine).  Patients are able to view lab/test results, encounter notes, upcoming appointments, etc.  Non-urgent messages can be sent to your provider as well.   To learn more about what you can do with MyChart, go to [email or web address].    Your next appointment:   2 month(s)  The format for your next appointment:   In Person  Provider:   Glenetta Hew, MD     Your cardiac CT will be scheduled at  the below location:   [facility] [address] [place], [place] [address] [phone]     [facility],  Please arrive at the [facility] main entrance (entrance A) of [facility] 30 minutes prior to test start time. Proceed to the [facility] Radiology Department (first floor) to check-in and test prep.    Please follow these instructions carefully (unless otherwise directed):  PLEASE HAV LAB BMP AT LEAST 7 DAYS prior to test.  On the Night Before the Test: . Be sure to Drink plenty of water. . Do not consume any caffeinated/decaffeinated beverages or chocolate 12 hours prior to your test. . Do not take any antihistamines 12 hours prior to your test.  On the Day of the Test: . Drink plenty of water until 1 hour prior to the test. . Do not eat any food 4 hours prior to the test. . You may take your regular medications prior to the test.  . Take 100 MG metoprolol (Lopressor) two hours prior to test. FEMALES- please wear underwire-free bra if available         After the Test: . Drink plenty of water. . After receiving IV contrast, you may experience a mild flushed feeling. This is normal. . On occasion, you may experience a mild rash up to 24 hours after the test. This is not dangerous. If this occurs, you can take Benadryl 25 mg and increase your fluid intake. . If you experience trouble breathing, this can be  serious. If it is severe call 911 IMMEDIATELY. If it is mild, please call our office.    Once we have confirmed authorization from your insurance company, we will call you to set up a date and time for your test. Based on how quickly your insurance processes prior authorizations requests, please allow up to 4 weeks to be contacted for scheduling your Cardiac CT appointment. Be advised that routine Cardiac CT appointments could be scheduled as many as 8 weeks after your provider has ordered it.  For non-scheduling related questions, please contact the cardiac imaging  nurse navigator should you have any questions/concerns: Marchia Bond, Cardiac Imaging Nurse Navigator Gordy Clement, Cardiac Imaging Nurse Navigator [facility] Direct Office Dial: [phone]   For scheduling needs, including cancellations and rescheduling, please call [place], [phone].

## 2020-04-13 ENCOUNTER — Encounter: Payer: Self-pay | Admitting: Cardiology

## 2020-04-13 DIAGNOSIS — Z6841 Body Mass Index (BMI) 40.0 and over, adult: Secondary | ICD-10-CM | POA: Insufficient documentation

## 2020-04-13 HISTORY — DX: Morbid (severe) obesity due to excess calories: E66.01

## 2020-04-13 NOTE — Assessment & Plan Note (Addendum)
Very out-of-control hyperlipidemia => total cholesterol 269 with an LDL of 191.  Depending on upcoming results, would probably need to be very aggressive in treating.  She is not currently on any medications, I suspect she will need to be on something.  With a family history, which should for an LDL of at least < 100 given her other risk factors.  Pending ischemic evaluation, would potentially consider eating even more aggressive.

## 2020-04-13 NOTE — Assessment & Plan Note (Signed)
She has been notable family history for CAD-half brother had an MI at 61, and mother had MI at 80. She has obesity and hyperlipidemia.  I do not see hemoglobin A1c but glucose is elevated. (Obesity, hypertension, TG greater >150, glucose intolerance with fasting glucose of 657) = metabolic syndrome -> this does put her at higher risk for cardiovascular disease  She has pretty significant hyperlipidemia and is very sedentary.  She does not think she can walk on treadmill.  My consideration initially was to do a GXT, but she could not walk on the treadmill.  As such, I think the best course of action will be to do a coronary CTA which will give [place] a calcium score results as well as the endoscopic evaluation with the CT scan angiogram as well as the potential for FFR as a stress test equivalent.  Plan: Coronary CTA-FFR -> this includes the coronary calcium score and anatomic evaluation with angiogram-which is beyond the capacity of a nuclear stress test..  We will probably need to address her hyperlipidemia with a statin and possibly more  We will defer prediabetes evaluation to PCP.

## 2020-04-13 NOTE — Assessment & Plan Note (Signed)
Strange location for chest discomfort.  She isolates it in almost the mid axillary area but that her pain is across her chest.  The symptoms are also unusual that there really is not any true association with activity.  I do think is reasonable to do an ischemic evaluation.  My initial thought was to do a GXT, but she indicated she would not do it on treadmill.  Her body habitus will make a Myoview less than favorable, and I chose coronary CTA which gives [place] both coronary calcium score and coronary anatomy data that is more telling them a Myoview stress test would be.  If there is a lesion, it can be evaluated with CT FFR.  With this data, even if there is no suggestion of ischemic CAD, we can tailor therapy risk factor modification based on risk stratification.  Plan: Coronary CTA-FFR

## 2020-04-13 NOTE — Assessment & Plan Note (Signed)
There is suggestion of possible prior infarct which is probably more related to body habitus.   Difficult to know for sure.  We are evaluating chest discomfort with a coronary CTA

## 2020-04-19 ENCOUNTER — Encounter (INDEPENDENT_AMBULATORY_CARE_PROVIDER_SITE_OTHER): Payer: Self-pay

## 2020-04-27 ENCOUNTER — Telehealth (HOSPITAL_COMMUNITY): Payer: Self-pay | Admitting: *Deleted

## 2020-04-27 NOTE — Telephone Encounter (Signed)
Reaching out to patient to offer assistance regarding upcoming cardiac imaging study; pt verbalizes understanding of appt date/time, parking situation and where to check in, pre-test NPO status and medications ordered, and verified current allergies; name and call back number provided for further questions should they arise  Cortlan Dolin RN Navigator Cardiac Imaging [facility] [phone] office [phone] cell  

## 2020-04-28 ENCOUNTER — Other Ambulatory Visit: Payer: Self-pay

## 2020-04-28 ENCOUNTER — Ambulatory Visit (HOSPITAL_COMMUNITY)
Admission: RE | Admit: 2020-04-28 | Discharge: 2020-04-28 | Disposition: A | Discharge status: Home / Self Care | Payer: PRIVATE HEALTH INSURANCE | Source: Ambulatory Visit | Attending: Cardiology | Admitting: Cardiology

## 2020-04-28 ENCOUNTER — Encounter: Payer: PRIVATE HEALTH INSURANCE | Admitting: *Deleted

## 2020-04-28 DIAGNOSIS — E7849 Other hyperlipidemia: Secondary | ICD-10-CM

## 2020-04-28 DIAGNOSIS — R9431 Abnormal electrocardiogram [ECG] [EKG]: Secondary | ICD-10-CM | POA: Diagnosis present

## 2020-04-28 DIAGNOSIS — Z8249 Family history of ischemic heart disease and other diseases of the circulatory system: Secondary | ICD-10-CM

## 2020-04-28 DIAGNOSIS — R079 Chest pain, unspecified: Secondary | ICD-10-CM

## 2020-04-28 DIAGNOSIS — R0789 Other chest pain: Secondary | ICD-10-CM | POA: Diagnosis present

## 2020-04-28 DIAGNOSIS — I700 Atherosclerosis of aorta: Secondary | ICD-10-CM

## 2020-04-28 DIAGNOSIS — Z006 Encounter for examination for normal comparison and control in clinical research program: Secondary | ICD-10-CM

## 2020-04-28 MED ORDER — NITROGLYCERIN 0.4 MG SL SUBL
SUBLINGUAL_TABLET | SUBLINGUAL | Status: AC
  Filled 2020-04-28: qty 2

## 2020-04-28 MED ORDER — IOHEXOL 350 MG/ML SOLN
80.0000 mL | Freq: Once | INTRAVENOUS | Status: AC | PRN
  Administered 2020-04-28: 80 mL via INTRAVENOUS

## 2020-04-28 MED ORDER — NITROGLYCERIN 0.4 MG SL SUBL
0.8000 mg | SUBLINGUAL_TABLET | Freq: Once | SUBLINGUAL | Status: AC
  Administered 2020-04-28: 0.8 mg via SUBLINGUAL

## 2020-04-28 NOTE — Research (Signed)
IDENTIFY Informed Consent        Subject Name:   Regina Lindsey       Time of Consent: 0700  Subject met inclusion and exclusion criteria.  The informed consent form, study requirements and expectations were reviewed with the subject and questions and concerns were addressed prior to the signing of the consent form.  The subject verbalized understanding of the trial requirements.  The subject agreed to participate in the IDENTIFY trial and signed the informed consent.  The informed consent was obtained prior to performance of any protocol-specific procedures for the subject.  A copy of the signed informed consent was given to the subject and a copy was placed in the subject's medical record.   Leota Jacobsen, BSN, CVRN-BC Cardioivascular Research Nurse [facility] [phone]

## 2020-05-01 ENCOUNTER — Ambulatory Visit (HOSPITAL_COMMUNITY)
Admission: RE | Admit: 2020-05-01 | Discharge: 2020-05-01 | Disposition: A | Discharge status: Home / Self Care | Payer: No Typology Code available for payment source | Source: Ambulatory Visit | Attending: Cardiology | Admitting: Cardiology

## 2020-05-01 DIAGNOSIS — R079 Chest pain, unspecified: Secondary | ICD-10-CM | POA: Diagnosis not present

## 2020-05-01 DIAGNOSIS — R0789 Other chest pain: Secondary | ICD-10-CM | POA: Diagnosis present

## 2020-05-01 DIAGNOSIS — Z8249 Family history of ischemic heart disease and other diseases of the circulatory system: Secondary | ICD-10-CM | POA: Insufficient documentation

## 2020-05-01 DIAGNOSIS — R9431 Abnormal electrocardiogram [ECG] [EKG]: Secondary | ICD-10-CM | POA: Diagnosis present

## 2020-05-01 DIAGNOSIS — E7849 Other hyperlipidemia: Secondary | ICD-10-CM | POA: Diagnosis present

## 2020-05-02 ENCOUNTER — Ambulatory Visit (INDEPENDENT_AMBULATORY_CARE_PROVIDER_SITE_OTHER): Payer: Self-pay | Admitting: Family Medicine

## 2020-05-03 ENCOUNTER — Telehealth: Payer: Self-pay | Admitting: Cardiology

## 2020-05-03 DIAGNOSIS — Z8249 Family history of ischemic heart disease and other diseases of the circulatory system: Secondary | ICD-10-CM

## 2020-05-03 DIAGNOSIS — E7849 Other hyperlipidemia: Secondary | ICD-10-CM

## 2020-05-03 NOTE — Telephone Encounter (Signed)
Per Dr Ellyn Hack , can have hepatic and lipid panel prior to virtual visit  patient verbalized understanding. She is aware of how virtual visit process  labslip faxed to patient as requested.

## 2020-05-03 NOTE — Telephone Encounter (Signed)
The patient was calling to see if Dr. Ellyn Hack would like to have labs completed before her appointment. She has not had a fasting lipid in two years.  She may have the lab orders faxed directly to her: [phone]

## 2020-05-03 NOTE — Telephone Encounter (Signed)
Patient sent following MyChart Message regarding labs:  Earle Gell to [place], [place] 12 minutes ago (2:52 PM): I just had The only thing he had order done and it should be in my chart was done on March 14 I believe maybe on the 11th I'm just wondering if he's going to want something before I come in I don't wanna have to make four trips to get this done if she could call me that would be great my direct number at work is [phone] or my cell phone number is [phone] 495 sanity thanks  [place], [place] to   [employer] E 48 minutes ago (2:15 PM): I do see where Dr. Ellyn Hack has placed lab orders for you, I'll have Dr. Allison Quarry nurse mail those lab orders to you. Will that work?  Earle Gell to  Darrin Nipper L 5 hours ago (9:26 AM) Thanks. I was wondering if you would check with Dr Ellyn Hack and see if he wants any labs done before I see him .  I can get them done at my office free and wanted him to have them so we can discuss at the appt .  Since my visit is virtual thought this might help him decide what the next steps are.   Please let patient know if additional labs need done.

## 2020-05-05 ENCOUNTER — Ambulatory Visit: Payer: Self-pay | Admitting: Cardiology

## 2020-05-05 ENCOUNTER — Encounter (INDEPENDENT_AMBULATORY_CARE_PROVIDER_SITE_OTHER): Payer: Self-pay

## 2020-05-16 ENCOUNTER — Ambulatory Visit (INDEPENDENT_AMBULATORY_CARE_PROVIDER_SITE_OTHER): Payer: Self-pay | Admitting: Family Medicine

## 2020-05-16 ENCOUNTER — Encounter: Payer: Self-pay | Admitting: Cardiology

## 2020-05-16 ENCOUNTER — Telehealth (INDEPENDENT_AMBULATORY_CARE_PROVIDER_SITE_OTHER): Payer: PRIVATE HEALTH INSURANCE | Admitting: Cardiology

## 2020-05-16 ENCOUNTER — Ambulatory Visit: Payer: PRIVATE HEALTH INSURANCE

## 2020-05-16 VITALS — BP 124/68 | HR 68 | Ht 61.0 in | Wt 244.0 lb

## 2020-05-16 DIAGNOSIS — R079 Chest pain, unspecified: Secondary | ICD-10-CM

## 2020-05-16 DIAGNOSIS — E7849 Other hyperlipidemia: Secondary | ICD-10-CM

## 2020-05-16 DIAGNOSIS — Z6841 Body Mass Index (BMI) 40.0 and over, adult: Secondary | ICD-10-CM | POA: Diagnosis not present

## 2020-05-16 DIAGNOSIS — Z8249 Family history of ischemic heart disease and other diseases of the circulatory system: Secondary | ICD-10-CM

## 2020-05-16 MED ORDER — ROSUVASTATIN CALCIUM 5 MG PO TABS: 5.0000 mg | ORAL_TABLET | Freq: Every day | ORAL | 3 refills | Status: DC

## 2020-05-16 NOTE — Assessment & Plan Note (Signed)
Follow-up labs did not show any better lipids.  In fact, it is worse.  Total cholesterol 277 with an LDL of 196.  I do think we need to consider treating.   my recommendation would be rosuvastatin 20 mg daily, but she is worried about tolerance.  We will start with 5 mg daily and reassess in 6 months in order to ensure that there is enough time for both her lifestyle modifications statin to have an effect.

## 2020-05-16 NOTE — Assessment & Plan Note (Signed)
She does have a family history of CAD and therefore we are monitoring closely.  But her coronary calcium score was 0.  This is hard to rationalize with poorly controlled lipids, and a CTA that shows possible stenosis.  As such, I think is not unreasonable to treat her lipids to get down to an LDL less than 100, but I do not know that we necessarily need to do aspirin.  Continue to monitor other risk factors such as blood pressure and sugars.  Starting rosuvastatin 5 mg daily.

## 2020-05-16 NOTE — Progress Notes (Signed)
04/07/2020 (Dr. Mayra Neer) Na+ 141, K+ 4.2, Cl- 105, HCO3-28, BUN 14, Cr 0.72, Glu 94, Ca2+ 9.8; AST 33, ALT 36, AlkP 91 CBC: W 10.7, H/H 15.2/46, Plt 269 TC 277 (non-HDL 230), TG 181, HDL 47, LDL 196  -> Following this result, was started on 5 mg rosuvastatin daily.  Reviewed in clinic.Glenetta Hew, MD

## 2020-05-16 NOTE — Progress Notes (Signed)
Virtual Visit via Video Note   This visit type was conducted due to national recommendations for restrictions regarding the COVID-19 Pandemic (e.g. social distancing) in an effort to limit this patient's exposure and mitigate transmission in our community.  Due to her co-morbid illnesses, this patient is at least at moderate risk for complications without adequate follow up.  This format is felt to be most appropriate for this patient at this time.  All issues noted in this document were discussed and addressed.  A limited physical exam was performed with this format.  Please refer to the patient's chart for her consent to telehealth for [facility]      Patient has given verbal permission to conduct this visit via virtual appointment and to bill insurance 05/16/2020 5:50 PM     Evaluation Performed:  Follow-up visit  Date:  05/16/2020   ID:  Regina Lindsey, DOB [birth year], MRN [id number]  Patient Location: Home Provider Location: Home Office  PCP:  Regina Neer, MD  Cardiologist:  Regina Hew, MD  Electrophysiologist:  None   Chief Complaint:   Chief Complaint  Patient presents with  . Follow-up    Test results  . Chest Pain    Has not had any chest pain in the last 2 weeks.  Has lost 10 pounds, and cut out sodas.    ====================================  ASSESSMENT & PLAN:    Problem List Items Addressed This Visit    Chest pain of uncertain etiology    She had unusual/atypical symptoms mostly axillary chest pain, not consistent with angina.  CT angiogram was limited by artifact and could not be evaluated with CT FFR.  I find it unusual with a coronary calcium score of 0 that she would have a stenotic or occluded vessel with collateral flow.  We talked about further testing.  I think if she were to have more symptoms, then we would be limited to either stress test or heart catheterization.  She feels as though with no recurrent symptoms she will prefer to avoid any  further testing for now.  This is peripherally reasonable.  If she would have more symptoms I think Myoview would be the next reasonable step.  I just worry that her body habitus may make for imaging to be difficult.      Morbid obesity with BMI of 45.0-49.9, adult ([place]) (Chronic)    The patient understands the need to lose weight with diet and exercise. We have discussed specific strategies for this. She is making some lifestyle modifications and has lost some weight.  I congratulated her on his efforts.      Family history of premature CAD (Chronic)    She does have a family history of CAD and therefore we are monitoring closely.  But her coronary calcium score was 0.  This is hard to rationalize with poorly controlled lipids, and a CTA that shows possible stenosis.  As such, I think is not unreasonable to treat her lipids to get down to an LDL less than 100, but I do not know that we necessarily need to do aspirin.  Continue to monitor other risk factors such as blood pressure and sugars.  Starting rosuvastatin 5 mg daily.      Hyperlipidemia due to dietary fat intake - Primary (Chronic)    Follow-up labs did not show any better lipids.  In fact, it is worse.  Total cholesterol 277 with an LDL of 196.  I do think we need to consider  treating.   my recommendation would be rosuvastatin 20 mg daily, but she is worried about tolerance.  We will start with 5 mg daily and reassess in 6 months in order to ensure that there is enough time for both her lifestyle modifications statin to have an effect.      Relevant Medications   rosuvastatin (CRESTOR) 5 MG tablet   Other Relevant Orders   NMR, lipoprofile      ====================================  History of Present Illness:    Regina Lindsey is a 60 y.o. female with PMH notable for obesity and hyperlipidemia, along with a family history with a half brother having MI at age 74, mother MI at age 31 who presents via audio/video  conferencing for a telehealth visit today as a 1 month follow-up after evaluation with his coronary CT angiogram.  Plan was to do a GXT, but she is not able to walk on the treadmill.  We therefore went with a coronary CTA.  Regina Lindsey was last seen April 10, 2020 for evaluation of atypical symptoms of left shoulder and left elbow pain as well as some intermittent left-sided chest discomfort.  It did seem to be somewhat tender to palpation but there is quick question about it potentially being worse with exertion.  Unfortunately, she indicated that she would not be able to walk on a treadmill, therefore we are limited to either stress testing or coronary CTA.  We decided on coronary CTA which would also just give [place] baseline anatomic data.  Hospitalizations:  . None   Recent - Interim CV studies:   . The following studies were reviewed today: . Coronary CT Angiogram 04/28/2020: Unfortunately, limited by artifact.  Coronary calcium score 0.  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  Mid LAD is also not well visualized.  Unfortunately, because of artifact, unable to evaluate with CT FFR.  Inerval History.   Regina Lindsey is being seen via video visit today, stating that she really is doing fine.  She has not had any recurrent symptoms of chest discomfort or uneasiness in the last 10 to 14 days.  She is monitoring her heart rate and rhythm on her iWatch as she is not had any palpitations or irregular tachycardic episodes.  She has not had any resting exertional dyspnea or chest pain.  No CHF symptoms.  Overall doing well.  She did just get her lipids results checked by her PCP, and they were not very good as indicated.  She is amenable to try low-dose statin.  She is also made lifestyle changes as far as adjusting her diet and trying to increase exercise.  Was happy to see that her weight is down.  Cardiovascular ROS: positive for  - No further episodes of chest pain since beginning of the month.  Maybe little exertional dyspnea because of deconditioning, but improving. negative for - edema, irregular heartbeat, orthopnea, palpitations, paroxysmal nocturnal dyspnea, rapid heart rate, shortness of breath or Lightheadedness or dizziness, syncope/near syncope or TIA/amaurosis fugax, claudication   ROS:  Please see the history of present illness.    The patient does not have symptoms concerning for COVID-19 infection (fever, chills, cough, or new shortness of breath).  Review of Systems  Constitutional: Positive for weight loss (She has adjusted her diet and increase her exercise.). Negative for malaise/fatigue.  HENT: Negative for nosebleeds.   Respiratory: Negative for cough and shortness of breath.  Gastrointestinal: Negative for blood in stool.  Genitourinary: Negative for hematuria.  Musculoskeletal: Positive for joint pain (Mild arthritis pains in her knees., also notes left shoulder pain.).  Neurological: Negative for focal weakness and weakness.  Psychiatric/Behavioral: Negative.       Past Medical History:  Diagnosis Date  . 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 Complication of anesthesia    hard to wake up  . Contact lens/glasses fitting    wears contacts or glasses  . Headache(784.0)    Migraines  . Hyperlipidemia   . MDD (recurrent major depressive disorder) in remission ([place])   . Morbid obesity with BMI of 45.0-49.9, adult ([place]) 04/13/2020   BMI 47.43  . Overactive bladder   . PONV (postoperative nausea and vomiting)    Past Surgical History:  Procedure Laterality Date  . CARPAL TUNNEL RELEASE  2004   right  . CHOLECYSTECTOMY    . DILATATION & CURETTAGE/HYSTEROSCOPY WITH MYOSURE N/A 07/27/2015   Procedure: DILATATION & CURETTAGE/HYSTEROSCOPY WITH MYOSURE;  Surgeon: Janyth Pupa, DO;  Location: [facility];  Service: Gynecology;  Laterality: N/A;  Polypectomy  .  KNEE ARTHROSCOPY WITH LATERAL MENISECTOMY  03/27/2012   Procedure: KNEE ARTHROSCOPY WITH LATERAL MENISECTOMY;  Surgeon: Johnny Bridge, MD;  Location: [facility];  Service: Orthopedics;  Laterality: Left;  left knee arthroscopy with partial lateral menisectomy  . ORIF ANKLE FRACTURE  1980   right  . SHOULDER ARTHROSCOPY  2001   right  . TONSILLECTOMY AND ADENOIDECTOMY  1980   age 44     Current Meds  Medication Sig  . Ascorbic Acid (VITAMIN C PO) Take 1 capsule by mouth as needed.  . cyclobenzaprine (FLEXERIL) 10 MG tablet Take 10 mg by mouth 3 (three) times daily as needed for muscle spasms.   Marland Kitchen ibuprofen (ADVIL,MOTRIN) 800 MG tablet Take 800 mg by mouth every 8 (eight) hours as needed for moderate pain.   . Multiple Vitamins-Minerals (ZINC PO) Take 1 tablet by mouth daily.  . RELPAX 40 MG tablet Take 40 mg by mouth every 2 (two) hours as needed for migraine (may repeat after 2 hours if headache/migraine returns).   . rosuvastatin (CRESTOR) 5 MG tablet Take 1 tablet (5 mg total) by mouth daily.  Marland Kitchen VITAMIN D, CHOLECALCIFEROL, PO Take 1 capsule by mouth daily.     Allergies:   Celebrex [celecoxib], Penicillins, Sulfa antibiotics, Codeine, Versed [midazolam], and Neomycin   Social History   Tobacco Use  . Smoking status: Former Smoker    Types: Cigarettes    Quit date: 03/23/2005    Years since quitting: 15.1  . Smokeless tobacco: Never Used  Substance Use Topics  . Alcohol use: No  . Drug use: No     Family Hx: The patient's family history includes Atrial fibrillation in her father; Coronary artery disease (age of onset: 34) in her half-brother and mother; Heart attack (age of onset: 38) in her half-brother; Heart attack (age of onset: 72) in her mother; Heart disease in her maternal grandfather and maternal grandmother; Heart failure (age of onset: 32) in her mother; Hyperlipidemia in her brother, brother, brother, father, half-brother, and paternal grandmother;  Hyperlipidemia (age of onset: 44) in her mother; Hypertension in her brother, father, and half-brother; Hypertension (age of onset: 46) in her mother; Lymphoma in her paternal grandfather; Spina bifida in her brother; Stroke in her maternal grandmother; Stroke (age of onset: 102) in her father.  Labs/Other Tests and Data Reviewed:    EKG:  No ECG reviewed.  Recent Labs: No results found for requested labs within last 8760 hours.   Recent Lipid Panel  04/07/2020 (Dr. Mayra Lindsey) Na+ 141, K+ 4.2, Cl- 105, HCO3-28, BUN 14, Cr 0.72, Glu 94, Ca2+ 9.8; AST 33, ALT 36, AlkP 91 CBC: W 10.7, H/H 15.2/46, Plt 269 TC 277 (non-HDL 230), TG 181, HDL 47, LDL 196 No results found for: CHOL, TRIG, HDL, CHOLHDL, LDLCALC, LDLDIRECT  Wt Readings from Last 3 Encounters:  05/16/20 244 lb (110.7 kg)  04/10/20 251 lb (113.9 kg)  07/27/15 241 lb (109.3 kg)     Objective:    Vital Signs:  BP 124/68   Pulse 68   Ht 5\' 1"  (1.549 m)   Wt 244 lb (110.7 kg)   BMI 46.10 kg/m   VITAL SIGNS:  reviewed Well nourished, well developed female in no acute distress. A&O x 3.  Normal Mood & Affect Non-labored respirations  ==========================================  COVID-19 Education: The signs and symptoms of COVID-19 were discussed with the patient and how to seek care for testing (follow up with PCP or arrange E-visit).   The importance of social distancing was discussed today.  Time:    Today, I have spent 18 minutes with the patient with telehealth technology discussing the above problems.   An additional 8 minutes spent charting (reviewing prior notes, hospital records, studies, labs etc.) Total 26 minutes   Medication Adjustments/Labs and Tests Ordered: Current medicines are reviewed at length with the patient today.  Concerns regarding medicines are outlined above.   Patient Instructions  Medication Instructions:  Start Rosuvastatin 5 mg daily   *If you need a refill on your cardiac  medications before your next appointment, please call your pharmacy*   Lab Work: In~6 months we will have you get the extended lipid panel with breakdown of particle number and size  If you have labs (blood work) drawn today and your tests are completely normal, you will receive your results only by: Marland Kitchen MyChart Message (if you have MyChart) OR . A paper copy in the mail If you have any lab test that is abnormal or we need to change your treatment, we will call you to review the results.   Testing/Procedures: Since the CT angiogram had significant artifact, if you were to have recurrence of any chest discomfort, my recommendation would be to either use a Myoview stress test or potentially heart catheterization.  Since you are not actively having symptoms, we will hold off on further testing.  Follow-Up: At [facility], you and your health needs are our priority.  As part of our continuing mission to provide you with exceptional heart care, we have created designated Provider Care Teams.  These Care Teams include your primary Cardiologist (physician) and Advanced Practice Providers (APPs -  Physician Assistants and Nurse Practitioners) who all work together to provide you with the care you need, when you need it.  We recommend signing up for the patient portal called "MyChart".  Sign up information is provided on this After Visit Summary.  MyChart is used to connect with patients for Virtual Visits (Telemedicine).  Patients are able to view lab/test results, encounter notes, upcoming appointments, etc.  Non-urgent messages can be sent to your provider as well.   To learn more about what you can do with MyChart, go to [email or web address].    Your next appointment:   6 month(s) -> make sure labs are done  prior to this visit  The format for your next appointment:   Either in person or virtual  Provider:   Glenetta Hew, MD   Other Instructions Keep work on dietary modifications and  weight loss.  We are open to decrease your cholesterol levels.     Signed, Regina Hew, MD  05/16/2020 5:50 PM    [place]

## 2020-05-16 NOTE — Assessment & Plan Note (Signed)
The patient understands the need to lose weight with diet and exercise. We have discussed specific strategies for this. She is making some lifestyle modifications and has lost some weight.  I congratulated her on his efforts.

## 2020-05-16 NOTE — Patient Instructions (Addendum)
Medication Instructions:  Start Rosuvastatin 5 mg daily   *If you need a refill on your cardiac medications before your next appointment, please call your pharmacy*   Lab Work: In~6 months we will have you get the extended lipid panel with breakdown of particle number and size  If you have labs (blood work) drawn today and your tests are completely normal, you will receive your results only by: Marland Kitchen MyChart Message (if you have MyChart) OR . A paper copy in the mail If you have any lab test that is abnormal or we need to change your treatment, we will call you to review the results.   Testing/Procedures: Since the CT angiogram had significant artifact, if you were to have recurrence of any chest discomfort, my recommendation would be to either use a Myoview stress test or potentially heart catheterization.  Since you are not actively having symptoms, we will hold off on further testing.  Follow-Up: At [facility], you and your health needs are our priority.  As part of our continuing mission to provide you with exceptional heart care, we have created designated Provider Care Teams.  These Care Teams include your primary Cardiologist (physician) and Advanced Practice Providers (APPs -  Physician Assistants and Nurse Practitioners) who all work together to provide you with the care you need, when you need it.  We recommend signing up for the patient portal called "MyChart".  Sign up information is provided on this After Visit Summary.  MyChart is used to connect with patients for Virtual Visits (Telemedicine).  Patients are able to view lab/test results, encounter notes, upcoming appointments, etc.  Non-urgent messages can be sent to your provider as well.   To learn more about what you can do with MyChart, go to [email or web address].    Your next appointment:   6 month(s) -> make sure labs are done prior to this visit  The format for your next appointment:   Either in person or  virtual  Provider:   Glenetta Hew, MD   Other Instructions Keep work on dietary modifications and weight loss.  We are open to decrease your cholesterol levels.

## 2020-05-16 NOTE — Assessment & Plan Note (Signed)
Regina Lindsey had unusual/atypical symptoms mostly axillary chest pain, not consistent with angina.  CT angiogram was limited by artifact and could not be evaluated with CT FFR.  I find it unusual with a coronary calcium score of 0 that Regina Lindsey would have a stenotic or occluded vessel with collateral flow.  We talked about further testing.  I think if Regina Lindsey were to have more symptoms, then we would be limited to either stress test or heart catheterization.  Regina Lindsey feels as though with no recurrent symptoms Regina Lindsey will prefer to avoid any further testing for now.  This is peripherally reasonable.  If Regina Lindsey would have more symptoms I think Myoview would be the next reasonable step.  I just worry that her body habitus may make for imaging to be difficult.

## 2020-05-18 ENCOUNTER — Ambulatory Visit: Payer: PRIVATE HEALTH INSURANCE

## 2020-05-29 ENCOUNTER — Other Ambulatory Visit: Payer: Self-pay | Admitting: Advanced Practice Midwife

## 2020-05-29 ENCOUNTER — Other Ambulatory Visit: Payer: Self-pay | Admitting: Family Medicine

## 2020-05-29 DIAGNOSIS — Z1231 Encounter for screening mammogram for malignant neoplasm of breast: Secondary | ICD-10-CM

## 2020-07-13 ENCOUNTER — Ambulatory Visit: Payer: PRIVATE HEALTH INSURANCE | Admitting: Cardiology

## 2020-07-17 DIAGNOSIS — Z1231 Encounter for screening mammogram for malignant neoplasm of breast: Secondary | ICD-10-CM

## 2020-08-06 NOTE — Progress Notes (Signed)
LABS 04/07/2020 (Dr. Mayra Neer) Na+ 141, K+ 4.2, Cl- 105, HCO3-28, BUN 14, Cr 0.72, Glu 94, Ca2+ 9.8; AST 33, ALT 36, AlkP 91 CBC: W 10.7, H/H 15.2/46, Plt 269 TC 277 (non-HDL 230), TG 181, HDL 47, LDL 196  Regina Lindsey W, MD

## 2020-09-18 ENCOUNTER — Telehealth: Payer: Self-pay

## 2020-09-18 DIAGNOSIS — Z006 Encounter for examination for normal comparison and control in clinical research program: Secondary | ICD-10-CM

## 2020-09-18 NOTE — Telephone Encounter (Signed)
I have attempted without success to contact this patient by phone for her Identify 90 day follow up phone call. I left a message for patient to return my phone call with my name and callback number. An e-mail was also sent to patient.  

## 2020-11-15 ENCOUNTER — Ambulatory Visit: Payer: PRIVATE HEALTH INSURANCE | Admitting: Cardiology

## 2021-04-23 ENCOUNTER — Ambulatory Visit (INDEPENDENT_AMBULATORY_CARE_PROVIDER_SITE_OTHER): Payer: No Typology Code available for payment source | Admitting: Cardiology

## 2021-04-23 ENCOUNTER — Other Ambulatory Visit: Payer: Self-pay

## 2021-04-23 ENCOUNTER — Encounter: Payer: Self-pay | Admitting: Cardiology

## 2021-04-23 VITALS — BP 128/88 | HR 78 | Ht 61.05 in | Wt 254.0 lb

## 2021-04-23 DIAGNOSIS — Z6841 Body Mass Index (BMI) 40.0 and over, adult: Secondary | ICD-10-CM

## 2021-04-23 DIAGNOSIS — E7849 Other hyperlipidemia: Secondary | ICD-10-CM | POA: Diagnosis not present

## 2021-04-23 DIAGNOSIS — R079 Chest pain, unspecified: Secondary | ICD-10-CM

## 2021-04-23 DIAGNOSIS — Z8249 Family history of ischemic heart disease and other diseases of the circulatory system: Secondary | ICD-10-CM | POA: Diagnosis not present

## 2021-04-23 NOTE — Patient Instructions (Signed)
Medication Instructions:  No changes      Lab Work: Lipid- fasting CMP  If you have labs (blood work) drawn today and your tests are completely normal, you will receive your results only by: MyChart Message (if you have MyChart) OR A paper copy in the mail If you have any lab test that is abnormal or we need to change your treatment, we will call you to review the results.   Testing/Procedures:  Not needed  Follow-Up: At [facility], you and your health needs are our priority.  As part of our continuing mission to provide you with exceptional heart care, we have created designated Provider Care Teams.  These Care Teams include your primary Cardiologist (physician) and Advanced Practice Providers (APPs -  Physician Assistants and Nurse Practitioners) who all work together to provide you with the care you need, when you need it.     Your next appointment:   Follow up as needed  The format for your next appointment:   In Person  Provider:   Glenetta Hew, MD

## 2021-04-23 NOTE — Assessment & Plan Note (Signed)
Clearly needs to lose weight, has not done well over the last couple years and though she knows she needs to.  Question if she would benefit from [facility] or [facility].  She may benefit from bariatric surgery.  However to do the either 1 of these, she has stability to her part which includes making certain sacrifices and tolerating medication.

## 2021-04-23 NOTE — Progress Notes (Signed)
Primary Care Provider: Mayra Neer, MD Cardiologist: Glenetta Hew, MD Electrophysiologist: None  Clinic Note: Chief Complaint  Patient presents with   Follow-up    Annual.   Hyperlipidemia   ===================================  ASSESSMENT/PLAN   Problem List Items Addressed This Visit       Cardiology Problems   Hyperlipidemia due to dietary fat intake - Primary (Chronic)    She has not had lipids done since March 2022.  At that time, there is no notable change.  She seems to be inclined to give lifestyle modification with "dietary modification" and exercise a chance.  She did not tolerate even 5 mg of rosuvastatin which would indicate that she probably is just not going to take a statin.  I talked about potentially converting to PCSK9 inhibitor.  Not unreasonable to allow time to reassess after dietary modification if there is a concerted effort.  She has risk factors, but with her Coronary Calcium Score 00 where not obligated to be overly aggressive.  Plan: Recheck lipids now, would then probably benefit from check in about 6 months.  If she is agreeable at that time, we can consider referral to [facility], but she wants to think about it.  As such we will have her follow-up on as-needed basis.      Relevant Orders   EKG 12-Lead   Lipid panel   Comprehensive metabolic panel     Other   Morbid obesity with BMI of 45.0-49.9, adult (HCC) (Chronic)    Clearly needs to lose weight, has not done well over the last couple years and though she knows she needs to.  Question if she would benefit from [place] or [facility].  She may benefit from bariatric surgery.  However to do the either 1 of these, she has stability to her part which includes making certain sacrifices and tolerating medication.      Relevant Orders   Lipid panel   Comprehensive metabolic panel   Chest pain of uncertain etiology    No further chest pain.  Unable to perform FFR ct on her CT angiogram, but Coronary  Calcium Score 0.  Did not have any more symptoms of chest pain, palpitations or lightheadedness.  Obesity/deconditioning related exertional dyspnea.  With no further episodes of chest pain, no need for evaluation.      Family history of premature CAD (Chronic)    Significant family history of CAD but she herself has Coronary Calcium Score 0.  I do think that getting an LDL less than 100 is reasonable.  But her most recent LDL was 196.  I do not think this can be done with lifestyle modification.  In fact I think it probably will require more than hemostat.  I suspect PCSK9 better may be a best option.  She listened over the to say and then proceeded so that she wants to give lifestyle modification chance.  Thankfully, her blood pressure is controlled and she is asymptomatic.  She is going to try to work on lifestyle modifications and will reconsider potential referral to lipid clinic.      Relevant Orders   EKG 12-Lead   Lipid panel   Comprehensive metabolic panel   ===================================  HPI:    Regina Lindsey is a morbidly obese 61 y.o. female with a family history of premature CAD below who presents today for annual follow-up.  Regina Lindsey was last seen on May 16, 2020 for follow-up after test results.  She was not able to  walk on treadmill to perform GXT, so we converted to Coronary CTA.   Coronary CTA revealed Coronary Calcium Score 0, but CT FFR was not able to be done due to to artifact.  She had not had any recurrent chest discomfort for about 14 days.  Was monitoring her heart rhythm on her Apple Watch and has not had any of the irregular episodes. => We did start low-dose statin-rosuvastatin 5 mg daily.  Recent Hospitalizations: None   Reviewed  CV studies:    The following studies were reviewed today: (if available, images/films reviewed: From Epic Chart or Care Everywhere) None:  Interval History:   Regina Lindsey is doin overall pretty well.   Recovering from [place] in [place] with some residual DOE.  No more CP or palpitations. No PND, orthopnea with trivial edema.  No syncope or near syncope.  No TIA or amaurosis fugax.  Besides exertional dyspnea no chest pain at rest or exertion.  Noted generalized malaise, HA & myalgias that resolved when stopped low dose Crestor.  She that she is planning to start weight watchers, but did not get started yet due to social issues happening this past week.  She is able to do weight watchers and pick up exercise.  With this in mind she is open to lose weight which will help her blood pressure and bring her sugars and lipids down.  She wants to give lifestyle modification a chance to see how it helps with lowering her lipids.  CV Review of Symptoms (Summary) Cardiovascular ROS: positive for - dyspnea on exertion, edema, and - DOE since COVID in Dec; trivial L foot / ankle swelling & R ankle chrnoically swells since fracture.   negative for - chest pain, irregular heartbeat, orthopnea, palpitations, paroxysmal nocturnal dyspnea, rapid heart rate, shortness of breath, or syncoep / near suncope; TIA/amaurosis fugax, claudication.  REVIEWED OF SYSTEMS   Review of Systems  Constitutional:  Negative for malaise/fatigue (gettin energy back fro COVID) and weight loss (Actually gained some weight back.).  HENT:  Negative for congestion and nosebleeds.   Respiratory:  Positive for shortness of breath (His exertional dyspnea after COVID.  Also very deconditioned.). Negative for cough.   Cardiovascular:        Per HPI  Gastrointestinal:  Negative for blood in stool and melena.  Genitourinary:  Negative for dysuria, flank pain and frequency.  Musculoskeletal:  Positive for back pain and joint pain (chronc R ankle pain).  Neurological:  Negative for dizziness, focal weakness, weakness and headaches.  Psychiatric/Behavioral:  Negative for depression and memory loss. The patient is nervous/anxious (anxious about  mother's upcoming Sgx - not sleeping well; otherwise no issues).     I have reviewed and (if needed) personally updated the patient's problem list, medications, allergies, past medical and surgical history, social and family history.   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 [id number] ago   Complication of anesthesia    hard to wake up   Contact lens/glasses fitting    wears contacts or glasses   Headache(784.0)    Migraines   Hyperlipidemia    MDD (recurrent major depressive disorder) in remission ([place])    Morbid obesity with BMI of 45.0-49.9, adult ([place]) 04/13/2020   BMI 47.43   Overactive bladder    PONV (postoperative nausea and vomiting)     PAST SURGICAL HISTORY   Past Surgical  History:  Procedure Laterality Date   CARPAL TUNNEL RELEASE  2004   right   CHOLECYSTECTOMY     DILATATION & CURETTAGE/HYSTEROSCOPY WITH MYOSURE N/A 07/27/2015   Procedure: DILATATION & CURETTAGE/HYSTEROSCOPY WITH MYOSURE;  Surgeon: Janyth Pupa, DO;  Location: [facility];  Service: Gynecology;  Laterality: N/A;  Polypectomy   KNEE ARTHROSCOPY WITH LATERAL MENISECTOMY  03/27/2012   Procedure: KNEE ARTHROSCOPY WITH LATERAL MENISECTOMY;  Surgeon: Johnny Bridge, MD;  Location: [facility];  Service: Orthopedics;  Laterality: Left;  left knee arthroscopy with partial lateral menisectomy   ORIF ANKLE FRACTURE  1980   right   SHOULDER ARTHROSCOPY  2001   right   TONSILLECTOMY AND ADENOIDECTOMY  1980   age 48    Immunization History  Administered Date(s) Administered   Influenza-Unspecified 12/13/2019   PFIZER(Purple Top)SARS-COV-2 Vaccination 03/17/2019, 04/07/2019, 12/25/2019    MEDICATIONS/ALLERGIES   Current Meds  Medication Sig   albuterol (VENTOLIN HFA) 108 (90 Base) MCG/ACT inhaler 1 puff as needed for shortness of breath and wheezing.   Ascorbic Acid (VITAMIN C PO) Take 1 capsule by mouth as  needed.   Cholecalciferol (VITAMIN D) 50 MCG (2000 UT) tablet 1 tablet   cyclobenzaprine (FLEXERIL) 10 MG tablet Take 10 mg by mouth 3 (three) times daily as needed for muscle spasms.    ibuprofen (ADVIL,MOTRIN) 800 MG tablet Take 800 mg by mouth every 8 (eight) hours as needed for moderate pain.    Multiple Vitamins-Minerals (ZINC PO) Take 1 tablet by mouth daily.   RELPAX 40 MG tablet Take 40 mg by mouth every 2 (two) hours as needed for migraine (may repeat after 2 hours if headache/migraine returns).    venlafaxine XR (EFFEXOR-XR) 37.5 MG 24 hr capsule TAKE ONE CAPSULE BY MOUTH DAILY WITH FOOD   VITAMIN D, CHOLECALCIFEROL, PO Take 1 capsule by mouth daily.   Zinc 50 MG TABS 1 tablet    Allergies  Allergen Reactions   Celebrex [Celecoxib] Anaphylaxis   Penicillins Anaphylaxis    Has patient had a PCN reaction causing immediate rash, facial/tongue/throat swelling, SOB or lightheadedness with hypotension: Yes Has patient had a PCN reaction causing severe rash involving mucus membranes or skin necrosis: Yes Has patient had a PCN reaction that required hospitalization Yes Has patient had a PCN reaction occurring within the last 10 years: No If all of the above answers are "NO", then may proceed with Cephalosporin use.    Sulfa Antibiotics Anaphylaxis   Codeine Nausea And Vomiting   Versed [Midazolam]     Opposite effect-hyper-mean   Neomycin Rash    SOCIAL HISTORY/FAMILY HISTORY   Reviewed in Epic:  Pertinent findings:  Social History   Tobacco Use   Smoking status: Former    Types: Cigarettes    Quit date: 03/23/2005    Years since quitting: 16.0   Smokeless tobacco: Never  Substance Use Topics   Alcohol use: No   Drug use: No   Social History   Social History Narrative   She admittedly does not exercise much she should.   She works 10 to 12-hour days and is exhausted at the end of work.  Unfortunately, she does not exercise on her nonwork days.    OBJCTIVE -PE, EKG,  labs   Wt Readings from Last 3 Encounters:  04/23/21 254 lb (115.2 kg)  05/16/20 244 lb (110.7 kg)  04/10/20 251 lb (113.9 kg)    Physical Exam: BP 128/88    Pulse 78  Ht 5' 1.05" (1.551 m)    Wt 254 lb (115.2 kg)    SpO2 97%    BMI 47.91 kg/m  Physical Exam Vitals reviewed.  Constitutional:      General: She is not in acute distress.    Appearance: She is obese. She is not ill-appearing (Other than obese, healthy appearing.) or toxic-appearing.     Comments: Morbidly obese.  Well-groomed.  HENT:     Head: Normocephalic and atraumatic.  Cardiovascular:     Rate and Rhythm: Normal rate and regular rhythm. Occasional Extrasystoles are present.    Chest Wall: PMI is not displaced (Unable to palpate.).     Pulses: Decreased pulses (Diminished due to puffy swelling and body habitus.).     Heart sounds: S1 normal and S2 normal. Heart sounds are distant. No murmur heard.   No friction rub. No gallop.  Pulmonary:     Effort: Pulmonary effort is normal. No respiratory distress.     Breath sounds: Normal breath sounds. No wheezing or rales.     Comments: Just distant breath sounds due to body habitus.   Abdominal:     Comments: Truncal obesity.  Otherwise soft/NT/ND/NABS.  No HSM.  Musculoskeletal:        General: Swelling (Trivial bilateral ankle) present.     Right lower leg: Edema present.     Left lower leg: Edema present.  Skin:    General: Skin is warm.  Neurological:     General: No focal deficit present.     Mental Status: She is alert and oriented to person, place, and time.     Gait: Gait normal.  Psychiatric:        Mood and Affect: Mood normal.        Behavior: Behavior normal.        Thought Content: Thought content normal.     Comments: Questioned error grasping of the issues with her lipids.  Neurologic that all of a sudden she will lose weight is difficult to understand, but otherwise seems to have intact judgment     Adult ECG Report  Rate: 78 ;  Rhythm:  normal sinus rhythm and sinus arrhythmia; Left Axis Deviation.  Narrative Interpretation: n/a  Recent Labs:   05/05/2020: TC 03/30/1975, TG 181, HDL 40, LDL 196.-Did not have labs checked this year, because she did not take her statin. No results found for: CHOL, HDL, LDLCALC, LDLDIRECT, TRIG, CHOLHDL No results found for: CREATININE, BUN, NA, K, CL, CO2 CBC Latest Ref Rng & Units 03/27/2012  Hemoglobin 12.0 - 15.0 g/dL 15.2(H)    No results found for: HGBA1C No results found for: TSH  ==================================================  COVID-19 Education: The signs and symptoms of COVID-19 were discussed with the patient and how to seek care for testing (follow up with PCP or arrange E-visit).    I spent a total of 24 minutes with the patient spent in direct patient consultation.  Additional time spent with chart review  / charting (studies, outside notes, etc): 19 min Total Time: 43 min  Current medicines are reviewed at length with the patient today.  (+/- concerns) N/A  This visit occurred during the SARS-CoV-2 public health emergency.  Safety protocols were in place, including screening questions prior to the visit, additional usage of staff PPE, and extensive cleaning of exam room while observing appropriate contact time as indicated for disinfecting solutions.  Notice: This dictation was prepared with Dragon dictation along with smart phrase technology. Any transcriptional errors that  result from this process are unintentional and may not be corrected upon review.  Studies Ordered:   Orders Placed This Encounter  Procedures   Lipid panel   Comprehensive metabolic panel   EKG [id number]    Patient Instructions / Medication Changes & Studies & Tests Ordered   Patient Instructions  Medication Instructions:  No changes      Lab Work: Lipid- fasting CMP  If you have labs (blood work) drawn today and your tests are completely normal, you will receive your results only  by: MyChart Message (if you have MyChart) OR A paper copy in the mail If you have any lab test that is abnormal or we need to change your treatment, we will call you to review the results.   Testing/Procedures:  Not needed  Follow-Up: At [facility], you and your health needs are our priority.  As part of our continuing mission to provide you with exceptional heart care, we have created designated Provider Care Teams.  These Care Teams include your primary Cardiologist (physician) and Advanced Practice Providers (APPs -  Physician Assistants and Nurse Practitioners) who all work together to provide you with the care you need, when you need it.     Your next appointment:   Follow up as needed  The format for your next appointment:   In Person  Provider:   Glenetta Hew, MD      Glenetta Hew, M.D., M.S. Interventional Cardiologist   Pager # [phone] Phone # [phone] [address]. [place], [place] [address]   Thank you for choosing Heartcare at [facility]!!

## 2021-04-23 NOTE — Assessment & Plan Note (Signed)
She has not had lipids done since March 2022.  At that time, there is no notable change.  She seems to be inclined to give lifestyle modification with "dietary modification" and exercise a chance.  She did not tolerate even 5 mg of rosuvastatin which would indicate that she probably is just not going to take a statin.  I talked about potentially converting to PCSK9 inhibitor.  Not unreasonable to allow time to reassess after dietary modification if there is a concerted effort.  She has risk factors, but with her Coronary Calcium Score 00 where not obligated to be overly aggressive.  Plan: Recheck lipids now, would then probably benefit from check in about 6 months.  If she is agreeable at that time, we can consider referral to [facility], but she wants to think about it.  As such we will have her follow-up on as-needed basis.

## 2021-04-23 NOTE — Assessment & Plan Note (Addendum)
No further chest pain.  Unable to perform FFR ct on her CT angiogram, but Coronary Calcium Score 0.  Did not have any more symptoms of chest pain, palpitations or lightheadedness.  Obesity/deconditioning related exertional dyspnea.  With no further episodes of chest pain, no need for evaluation.

## 2021-04-23 NOTE — Assessment & Plan Note (Signed)
Significant family history of CAD but she herself has Coronary Calcium Score 0.  I do think that getting an LDL less than 100 is reasonable.  But her most recent LDL was 196.  I do not think this can be done with lifestyle modification.  In fact I think it probably will require more than hemostat.  I suspect PCSK9 better may be a best option.  She listened over the to say and then proceeded so that she wants to give lifestyle modification chance.  Thankfully, her blood pressure is controlled and she is asymptomatic.  She is going to try to work on lifestyle modifications and will reconsider potential referral to lipid clinic.

## 2021-06-15 ENCOUNTER — Other Ambulatory Visit: Payer: Self-pay | Admitting: Family Medicine

## 2021-06-15 DIAGNOSIS — Z1231 Encounter for screening mammogram for malignant neoplasm of breast: Secondary | ICD-10-CM

## 2021-06-20 ENCOUNTER — Ambulatory Visit
Admission: RE | Admit: 2021-06-20 | Discharge: 2021-06-20 | Disposition: A | Discharge status: Home / Self Care | Payer: No Typology Code available for payment source | Source: Ambulatory Visit | Attending: Family Medicine | Admitting: Family Medicine

## 2021-06-20 DIAGNOSIS — Z1231 Encounter for screening mammogram for malignant neoplasm of breast: Secondary | ICD-10-CM

## 2021-06-21 ENCOUNTER — Other Ambulatory Visit: Payer: Self-pay | Admitting: Family Medicine

## 2021-06-21 DIAGNOSIS — R928 Other abnormal and inconclusive findings on diagnostic imaging of breast: Secondary | ICD-10-CM

## 2021-06-25 HISTORY — PX: BREAST BIOPSY: SHX20

## 2021-06-28 ENCOUNTER — Other Ambulatory Visit: Payer: Self-pay | Admitting: Family Medicine

## 2021-06-28 ENCOUNTER — Ambulatory Visit
Admission: RE | Admit: 2021-06-28 | Discharge: 2021-06-28 | Disposition: A | Discharge status: Home / Self Care | Payer: No Typology Code available for payment source | Source: Ambulatory Visit | Attending: Family Medicine | Admitting: Family Medicine

## 2021-06-28 DIAGNOSIS — R928 Other abnormal and inconclusive findings on diagnostic imaging of breast: Secondary | ICD-10-CM

## 2021-06-28 DIAGNOSIS — R921 Mammographic calcification found on diagnostic imaging of breast: Secondary | ICD-10-CM

## 2021-07-06 ENCOUNTER — Ambulatory Visit
Admission: RE | Admit: 2021-07-06 | Discharge: 2021-07-06 | Disposition: A | Discharge status: Home / Self Care | Payer: No Typology Code available for payment source | Source: Ambulatory Visit | Attending: Family Medicine | Admitting: Family Medicine

## 2021-07-06 DIAGNOSIS — R921 Mammographic calcification found on diagnostic imaging of breast: Secondary | ICD-10-CM

## 2021-07-16 ENCOUNTER — Ambulatory Visit: Payer: Self-pay | Admitting: Surgery

## 2021-07-16 DIAGNOSIS — D242 Benign neoplasm of left breast: Secondary | ICD-10-CM

## 2021-07-16 NOTE — H&P (Signed)
Subjective    Chief Complaint: Breast Mass (Left breast papillary lesion with atypia)       History of Present Illness: Regina Lindsey is a 61 y.o. female who is seen today as an office consultation at the request of Dr. Brigitte Pulse for evaluation of Breast Mass (Left breast papillary lesion with atypia) .     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  She then underwent further work-up which showed a 0.5 x 0.7 cm cluster of calcifications.  Biopsy revealed focal atypia arising within a sclerotic papillary lesion with calcifications.  No sign of malignancy.  She is referred to [place] to discuss excision.  The patient has no family history of breast cancer and has had no previous breast problems.     Review of Systems: A complete review of systems was obtained from the patient.  I have reviewed this information and discussed as appropriate with the patient.  See HPI as well for other ROS.   Review of Systems  Constitutional: Negative.   HENT: Negative.   Eyes: Negative.   Respiratory: Negative.   Cardiovascular: Negative.   Gastrointestinal: Negative.   Genitourinary: Negative.   Musculoskeletal: Negative.   Skin: Negative.   Neurological: Negative.   Endo/Heme/Allergies: Negative.   Psychiatric/Behavioral: Negative.         Medical History:        Patient Active Problem List  Diagnosis   Atherosclerosis of aorta (CMS-HCC)   Diverticulitis   Family history of premature CAD   Fatty liver   Morbid obesity with BMI of 45.0-49.9, adult (CMS-HCC)   Recurrent major depression in full remission (CMS-HCC)      Past Surgical History       Past Surgical History:  Procedure Laterality Date   shoulder surgery   2018   knee surgery   2020        Allergies       Allergies  Allergen Reactions   Celecoxib Anaphylaxis and Other (See  Comments)   Midazolam Other (See Comments)      Opposite effect-hyper-mean     Penicillins Anaphylaxis      Has patient had a PCN reaction causing immediate rash, facial/tongue/throat swelling, SOB or lightheadedness with hypotension: Yes Has patient had a PCN reaction causing severe rash involving mucus membranes or skin necrosis: Yes Has patient had a PCN reaction that required hospitalization Yes Has patient had a PCN reaction occurring within the last 10 years: No If all of the above answers are "NO", then may proceed with Cephalosporin use.     Sulfa (Sulfonamide Antibiotics) Anaphylaxis              Current Outpatient Medications on File Prior to Visit  Medication Sig Dispense Refill   venlafaxine (EFFEXOR-XR) 37.5 MG XR capsule venlafaxine ER 37.5 mg capsule,extended release 24 hr        No current facility-administered medications on file prior to visit.      Family History       Family History  Problem Relation Age of Onset   Hyperlipidemia (Elevated cholesterol) Mother     Diabetes Mother     Coronary Artery Disease (Blocked arteries around heart) Mother     Hyperlipidemia (Elevated cholesterol) Father     Coronary Artery Disease (Blocked arteries around heart) Brother     Diabetes Brother  Hyperlipidemia (Elevated cholesterol) Brother          Social History       Tobacco Use  Smoking Status Never  Smokeless Tobacco Never      Social History  Social History        Socioeconomic History   Marital status: Divorced  Tobacco Use   Smoking status: Never   Smokeless tobacco: Never  Vaping Use   Vaping Use: Never used  Substance and Sexual Activity   Alcohol use: Never   Drug use: Never        Objective:         Vitals:    07/16/21 1009  BP: 128/78  Pulse: 106  Temp: 36.7 C (98 F)  SpO2: 98%  Weight: (!) 115.6 kg (254 lb 12.8 oz)  Height: 156.2 cm (5' 1.5")    Body mass index is 47.36 kg/m.   Physical Exam    Constitutional:   WDWN in NAD, conversant, no obvious deformities; lying in bed comfortably Eyes:  Pupils equal, round; sclera anicteric; moist conjunctiva; no lid lag HENT:  Oral mucosa moist; good dentition  Neck:  No masses palpated, trachea midline; no thyromegaly Lungs:  CTA bilaterally; normal respiratory effort Breasts:  symmetric, no nipple changes; no palpable masses or lymphadenopathy on either side; slight bruising medial left breast from the biopsy CV:  Regular rate and rhythm; no murmurs; extremities well-perfused with no edema Abd:  +bowel sounds, soft, non-tender, no palpable organomegaly; no palpable hernias Musc: Normal gait; no apparent clubbing or cyanosis in extremities Lymphatic:  No palpable cervical or axillary lymphadenopathy Skin:  Warm, dry; no sign of jaundice Psychiatric - alert and oriented x 4; calm mood and affect     Labs, Imaging and Diagnostic Testing: Diagnosis Breast, left, needle core biopsy, inner - FOCAL ATYPIA ARISING IN A SCLEROTIC PAPILLARY LESION WITH CALCIFICATIONS - SCLEROTIC FIBROADENOMATOID NODULE - SEE COMMENT Microscopic Comment By immunohistochemistry, the focus of atypia is negative for cytokeratin 5/6 and strongly positive for E-cadherin and ER. These results were called to [facility] on Jul 10, 2021. Thressa Sheller MD Pathologist, Electronic Signature (Case signed 07/10/2021)   CLINICAL DATA:  Screening.   EXAM: DIGITAL SCREENING BILATERAL MAMMOGRAM WITH TOMOSYNTHESIS AND CAD   TECHNIQUE: Bilateral screening digital craniocaudal and mediolateral oblique mammograms were obtained. Bilateral screening digital breast tomosynthesis was performed. The images were evaluated with computer-aided detection.   COMPARISON:  Previous exam(s).   ACR Breast Density Category b: There are scattered areas of fibroglandular density.   FINDINGS: In the left breast, calcifications warrant further evaluation. In the right breast, no findings  suspicious for malignancy.   IMPRESSION: Further evaluation is suggested for calcifications in the left breast.   RECOMMENDATION: Diagnostic mammogram of the left breast. (Code:[id number]-60M)   The patient will be contacted regarding the findings, and additional imaging will be scheduled.   BI-RADS CATEGORY  0: Incomplete. Need additional imaging evaluation and/or prior mammograms for comparison.     Electronically Signed   By: Fidela Salisbury M.D.   On: 06/20/2021 15:11   CLINICAL DATA:  Patient returns after screening study for evaluation of LEFT breast calcifications.   EXAM: DIGITAL DIAGNOSTIC UNILATERAL LEFT MAMMOGRAM WITH CAD   TECHNIQUE: Left digital diagnostic mammography was performed. Mammographic images were processed with CAD.   COMPARISON:  Previous exam(s).   ACR Breast Density Category b: There are scattered areas of fibroglandular density.   FINDINGS: Magnified views are performed of calcifications in  the MEDIAL aspect of the LEFT breast. These views demonstrate a group of pleomorphic calcifications spanning 0.5 x 0.7 centimeters and associated with focal asymmetry.   IMPRESSION: Indeterminate LEFT breast calcifications.   RECOMMENDATION: Stereotactic biopsy of LEFT breast.   I have discussed the findings and recommendations with the patient. If applicable, a reminder letter will be sent to the patient regarding the next appointment.   BI-RADS CATEGORY  4: Suspicious.     Electronically Signed   By: Nolon Nations M.D.   On: 06/28/2021 16:03   Assessment and Plan:  Diagnoses and all orders for this visit:   Mass of lower inner quadrant of left breast       Left breast radioactive seed localized lumpectomy.The surgical procedure has been discussed with the patient.  Potential risks, benefits, alternative treatments, and expected outcomes have been explained.  All of the patient's questions at this time have been answered.  The  likelihood of reaching the patient's treatment goal is good.  The patient understand the proposed surgical procedure and wishes to proceed.       Nickoli Bagheri Jearld Adjutant, MD  07/16/2021 10:31 AM

## 2021-07-16 NOTE — H&P (View-Only) (Signed)
Subjective    Chief Complaint: Breast Mass (Left breast papillary lesion with atypia)       History of Present Illness: Regina Lindsey is a 61 y.o. female who is seen today as an office consultation at the request of Dr. Brigitte Pulse for evaluation of Breast Mass (Left breast papillary lesion with atypia) .     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  She then underwent further work-up which showed a 0.5 x 0.7 cm cluster of calcifications.  Biopsy revealed focal atypia arising within a sclerotic papillary lesion with calcifications.  No sign of malignancy.  She is referred to [place] to discuss excision.  The patient has no family history of breast cancer and has had no previous breast problems.     Review of Systems: A complete review of systems was obtained from the patient.  I have reviewed this information and discussed as appropriate with the patient.  See HPI as well for other ROS.   Review of Systems  Constitutional: Negative.   HENT: Negative.   Eyes: Negative.   Respiratory: Negative.   Cardiovascular: Negative.   Gastrointestinal: Negative.   Genitourinary: Negative.   Musculoskeletal: Negative.   Skin: Negative.   Neurological: Negative.   Endo/Heme/Allergies: Negative.   Psychiatric/Behavioral: Negative.         Medical History:        Patient Active Problem List  Diagnosis   Atherosclerosis of aorta (CMS-HCC)   Diverticulitis   Family history of premature CAD   Fatty liver   Morbid obesity with BMI of 45.0-49.9, adult (CMS-HCC)   Recurrent major depression in full remission (CMS-HCC)      Past Surgical History       Past Surgical History:  Procedure Laterality Date   shoulder surgery   2018   knee surgery   2020        Allergies       Allergies  Allergen Reactions   Celecoxib Anaphylaxis and Other (See  Comments)   Midazolam Other (See Comments)      Opposite effect-hyper-mean     Penicillins Anaphylaxis      Has patient had a PCN reaction causing immediate rash, facial/tongue/throat swelling, SOB or lightheadedness with hypotension: Yes Has patient had a PCN reaction causing severe rash involving mucus membranes or skin necrosis: Yes Has patient had a PCN reaction that required hospitalization Yes Has patient had a PCN reaction occurring within the last 10 years: No If all of the above answers are "NO", then may proceed with Cephalosporin use.     Sulfa (Sulfonamide Antibiotics) Anaphylaxis              Current Outpatient Medications on File Prior to Visit  Medication Sig Dispense Refill   venlafaxine (EFFEXOR-XR) 37.5 MG XR capsule venlafaxine ER 37.5 mg capsule,extended release 24 hr        No current facility-administered medications on file prior to visit.      Family History       Family History  Problem Relation Age of Onset   Hyperlipidemia (Elevated cholesterol) Mother     Diabetes Mother     Coronary Artery Disease (Blocked arteries around heart) Mother     Hyperlipidemia (Elevated cholesterol) Father     Coronary Artery Disease (Blocked arteries around heart) Brother     Diabetes Brother  Hyperlipidemia (Elevated cholesterol) Brother          Social History       Tobacco Use  Smoking Status Never  Smokeless Tobacco Never      Social History  Social History        Socioeconomic History   Marital status: Divorced  Tobacco Use   Smoking status: Never   Smokeless tobacco: Never  Vaping Use   Vaping Use: Never used  Substance and Sexual Activity   Alcohol use: Never   Drug use: Never        Objective:         Vitals:    07/16/21 1009  BP: 128/78  Pulse: 106  Temp: 36.7 C (98 F)  SpO2: 98%  Weight: (!) 115.6 kg (254 lb 12.8 oz)  Height: 156.2 cm (5' 1.5")    Body mass index is 47.36 kg/m.   Physical Exam    Constitutional:   WDWN in NAD, conversant, no obvious deformities; lying in bed comfortably Eyes:  Pupils equal, round; sclera anicteric; moist conjunctiva; no lid lag HENT:  Oral mucosa moist; good dentition  Neck:  No masses palpated, trachea midline; no thyromegaly Lungs:  CTA bilaterally; normal respiratory effort Breasts:  symmetric, no nipple changes; no palpable masses or lymphadenopathy on either side; slight bruising medial left breast from the biopsy CV:  Regular rate and rhythm; no murmurs; extremities well-perfused with no edema Abd:  +bowel sounds, soft, non-tender, no palpable organomegaly; no palpable hernias Musc: Normal gait; no apparent clubbing or cyanosis in extremities Lymphatic:  No palpable cervical or axillary lymphadenopathy Skin:  Warm, dry; no sign of jaundice Psychiatric - alert and oriented x 4; calm mood and affect     Labs, Imaging and Diagnostic Testing: Diagnosis Breast, left, needle core biopsy, inner - FOCAL ATYPIA ARISING IN A SCLEROTIC PAPILLARY LESION WITH CALCIFICATIONS - SCLEROTIC FIBROADENOMATOID NODULE - SEE COMMENT Microscopic Comment By immunohistochemistry, the focus of atypia is negative for cytokeratin 5/6 and strongly positive for E-cadherin and ER. These results were called to [facility] on Jul 10, 2021. Thressa Sheller MD Pathologist, Electronic Signature (Case signed 07/10/2021)   CLINICAL DATA:  Screening.   EXAM: DIGITAL SCREENING BILATERAL MAMMOGRAM WITH TOMOSYNTHESIS AND CAD   TECHNIQUE: Bilateral screening digital craniocaudal and mediolateral oblique mammograms were obtained. Bilateral screening digital breast tomosynthesis was performed. The images were evaluated with computer-aided detection.   COMPARISON:  Previous exam(s).   ACR Breast Density Category b: There are scattered areas of fibroglandular density.   FINDINGS: In the left breast, calcifications warrant further evaluation. In the right breast, no findings  suspicious for malignancy.   IMPRESSION: Further evaluation is suggested for calcifications in the left breast.   RECOMMENDATION: Diagnostic mammogram of the left breast. (Code:[id number])   The patient will be contacted regarding the findings, and additional imaging will be scheduled.   BI-RADS CATEGORY  0: Incomplete. Need additional imaging evaluation and/or prior mammograms for comparison.     Electronically Signed   By: Fidela Salisbury M.D.   On: 06/20/2021 15:11   CLINICAL DATA:  Patient returns after screening study for evaluation of LEFT breast calcifications.   EXAM: DIGITAL DIAGNOSTIC UNILATERAL LEFT MAMMOGRAM WITH CAD   TECHNIQUE: Left digital diagnostic mammography was performed. Mammographic images were processed with CAD.   COMPARISON:  Previous exam(s).   ACR Breast Density Category b: There are scattered areas of fibroglandular density.   FINDINGS: Magnified views are performed of calcifications in  the MEDIAL aspect of the LEFT breast. These views demonstrate a group of pleomorphic calcifications spanning 0.5 x 0.7 centimeters and associated with focal asymmetry.   IMPRESSION: Indeterminate LEFT breast calcifications.   RECOMMENDATION: Stereotactic biopsy of LEFT breast.   I have discussed the findings and recommendations with the patient. If applicable, a reminder letter will be sent to the patient regarding the next appointment.   BI-RADS CATEGORY  4: Suspicious.     Electronically Signed   By: Nolon Nations M.D.   On: 06/28/2021 16:03   Assessment and Plan:  Diagnoses and all orders for this visit:   Mass of lower inner quadrant of left breast       Left breast radioactive seed localized lumpectomy.The surgical procedure has been discussed with the patient.  Potential risks, benefits, alternative treatments, and expected outcomes have been explained.  All of the patient's questions at this time have been answered.  The  likelihood of reaching the patient's treatment goal is good.  The patient understand the proposed surgical procedure and wishes to proceed.       Barron Vanloan Jearld Adjutant, MD  07/16/2021 10:31 AM

## 2021-07-19 ENCOUNTER — Other Ambulatory Visit: Payer: Self-pay | Admitting: Surgery

## 2021-07-19 DIAGNOSIS — D242 Benign neoplasm of left breast: Secondary | ICD-10-CM

## 2021-07-26 HISTORY — PX: BREAST EXCISIONAL BIOPSY: SUR124

## 2021-08-02 ENCOUNTER — Encounter (HOSPITAL_BASED_OUTPATIENT_CLINIC_OR_DEPARTMENT_OTHER): Payer: Self-pay | Admitting: Surgery

## 2021-08-13 ENCOUNTER — Ambulatory Visit
Admission: RE | Admit: 2021-08-13 | Discharge: 2021-08-13 | Disposition: A | Discharge status: Home / Self Care | Payer: No Typology Code available for payment source | Source: Ambulatory Visit | Attending: Surgery | Admitting: Surgery

## 2021-08-13 DIAGNOSIS — D242 Benign neoplasm of left breast: Secondary | ICD-10-CM

## 2021-08-13 NOTE — Progress Notes (Signed)

## 2021-08-14 ENCOUNTER — Encounter (HOSPITAL_BASED_OUTPATIENT_CLINIC_OR_DEPARTMENT_OTHER)
Admission: RE | Disposition: A | Discharge status: Home / Self Care | Payer: Self-pay | Source: Home / Self Care | Attending: Surgery

## 2021-08-14 ENCOUNTER — Ambulatory Visit
Admission: RE | Admit: 2021-08-14 | Discharge: 2021-08-14 | Disposition: A | Discharge status: Home / Self Care | Payer: No Typology Code available for payment source | Source: Ambulatory Visit | Attending: Surgery | Admitting: Surgery

## 2021-08-14 ENCOUNTER — Ambulatory Visit (HOSPITAL_BASED_OUTPATIENT_CLINIC_OR_DEPARTMENT_OTHER)
Admission: RE | Admit: 2021-08-14 | Discharge: 2021-08-14 | Disposition: A | Discharge status: Home / Self Care | Payer: No Typology Code available for payment source | Attending: Surgery | Admitting: Surgery

## 2021-08-14 ENCOUNTER — Encounter (HOSPITAL_BASED_OUTPATIENT_CLINIC_OR_DEPARTMENT_OTHER): Payer: Self-pay | Admitting: Surgery

## 2021-08-14 ENCOUNTER — Other Ambulatory Visit: Payer: Self-pay

## 2021-08-14 ENCOUNTER — Ambulatory Visit (HOSPITAL_BASED_OUTPATIENT_CLINIC_OR_DEPARTMENT_OTHER): Payer: No Typology Code available for payment source | Admitting: Certified Registered"

## 2021-08-14 DIAGNOSIS — D242 Benign neoplasm of left breast: Secondary | ICD-10-CM

## 2021-08-14 DIAGNOSIS — Z6841 Body Mass Index (BMI) 40.0 and over, adult: Secondary | ICD-10-CM | POA: Insufficient documentation

## 2021-08-14 DIAGNOSIS — N6320 Unspecified lump in the left breast, unspecified quadrant: Secondary | ICD-10-CM

## 2021-08-14 DIAGNOSIS — Z01818 Encounter for other preprocedural examination: Secondary | ICD-10-CM

## 2021-08-14 DIAGNOSIS — N6012 Diffuse cystic mastopathy of left breast: Secondary | ICD-10-CM | POA: Diagnosis not present

## 2021-08-14 DIAGNOSIS — Z87891 Personal history of nicotine dependence: Secondary | ICD-10-CM | POA: Insufficient documentation

## 2021-08-14 DIAGNOSIS — F32A Depression, unspecified: Secondary | ICD-10-CM | POA: Diagnosis not present

## 2021-08-14 HISTORY — PX: BREAST LUMPECTOMY WITH RADIOACTIVE SEED LOCALIZATION: SHX6424

## 2021-08-14 SURGERY — BREAST LUMPECTOMY WITH RADIOACTIVE SEED LOCALIZATION
Anesthesia: General | Site: Breast | Laterality: Left

## 2021-08-14 MED ORDER — FENTANYL CITRATE (PF) 100 MCG/2ML IJ SOLN
INTRAMUSCULAR | Status: DC | PRN
Start: 2021-08-14 — End: 2021-08-14
  Administered 2021-08-14: 50 ug via INTRAVENOUS
  Administered 2021-08-14: 25 ug via INTRAVENOUS

## 2021-08-14 MED ORDER — AMISULPRIDE (ANTIEMETIC) 5 MG/2ML IV SOLN
INTRAVENOUS | Status: AC
Start: 2021-08-14 — End: ?
  Filled 2021-08-14: qty 2

## 2021-08-14 MED ORDER — VANCOMYCIN HCL IN DEXTROSE 1-5 GM/200ML-% IV SOLN
1000.0000 mg | INTRAVENOUS | Status: AC
  Administered 2021-08-14: 1000 mg via INTRAVENOUS

## 2021-08-14 MED ORDER — ACETAMINOPHEN 500 MG PO TABS
ORAL_TABLET | ORAL | Status: AC
  Filled 2021-08-14: qty 2

## 2021-08-14 MED ORDER — FENTANYL CITRATE (PF) 100 MCG/2ML IJ SOLN: 25.0000 ug | INTRAMUSCULAR | Status: DC | PRN

## 2021-08-14 MED ORDER — CHLORHEXIDINE GLUCONATE CLOTH 2 % EX PADS: 6.0000 | MEDICATED_PAD | Freq: Once | CUTANEOUS | Status: DC

## 2021-08-14 MED ORDER — OXYCODONE HCL 5 MG/5ML PO SOLN: 5.0000 mg | Freq: Once | ORAL | Status: DC | PRN

## 2021-08-14 MED ORDER — AMISULPRIDE (ANTIEMETIC) 5 MG/2ML IV SOLN: 10.0000 mg | Freq: Once | INTRAVENOUS | Status: DC | PRN

## 2021-08-14 MED ORDER — PROPOFOL 500 MG/50ML IV EMUL
INTRAVENOUS | Status: AC
Start: 2021-08-14 — End: ?
  Filled 2021-08-14: qty 50

## 2021-08-14 MED ORDER — AMISULPRIDE (ANTIEMETIC) 5 MG/2ML IV SOLN
INTRAVENOUS | Status: DC | PRN
  Administered 2021-08-14: 5 mg via INTRAVENOUS

## 2021-08-14 MED ORDER — ONDANSETRON HCL 4 MG/2ML IJ SOLN
INTRAMUSCULAR | Status: DC | PRN
  Administered 2021-08-14: 4 mg via INTRAVENOUS

## 2021-08-14 MED ORDER — DEXMEDETOMIDINE (PRECEDEX) IN NS 20 MCG/5ML (4 MCG/ML) IV SYRINGE
PREFILLED_SYRINGE | INTRAVENOUS | Status: DC | PRN
  Administered 2021-08-14: 8 ug via INTRAVENOUS
  Administered 2021-08-14: 12 ug via INTRAVENOUS

## 2021-08-14 MED ORDER — VANCOMYCIN HCL IN DEXTROSE 1-5 GM/200ML-% IV SOLN
INTRAVENOUS | Status: AC
  Filled 2021-08-14: qty 200

## 2021-08-14 MED ORDER — LACTATED RINGERS IV SOLN: INTRAVENOUS | Status: DC

## 2021-08-14 MED ORDER — ONDANSETRON HCL 4 MG/2ML IJ SOLN
INTRAMUSCULAR | Status: AC
  Filled 2021-08-14: qty 2

## 2021-08-14 MED ORDER — ACETAMINOPHEN 500 MG PO TABS
1000.0000 mg | ORAL_TABLET | ORAL | Status: AC
  Administered 2021-08-14: 1000 mg via ORAL

## 2021-08-14 MED ORDER — OXYCODONE HCL 5 MG PO TABS: 5.0000 mg | ORAL_TABLET | Freq: Once | ORAL | Status: DC | PRN

## 2021-08-14 MED ORDER — PROPOFOL 500 MG/50ML IV EMUL
INTRAVENOUS | Status: DC | PRN
  Administered 2021-08-14 (×2): 150 ug/kg/min via INTRAVENOUS

## 2021-08-14 MED ORDER — DEXAMETHASONE SODIUM PHOSPHATE 4 MG/ML IJ SOLN
INTRAMUSCULAR | Status: DC | PRN
  Administered 2021-08-14: 10 mg via INTRAVENOUS

## 2021-08-14 MED ORDER — LIDOCAINE 2% (20 MG/ML) 5 ML SYRINGE
INTRAMUSCULAR | Status: DC | PRN
  Administered 2021-08-14: 60 mg via INTRAVENOUS

## 2021-08-14 MED ORDER — CHLORHEXIDINE GLUCONATE CLOTH 2 % EX PADS
6.0000 | MEDICATED_PAD | Freq: Once | CUTANEOUS | Status: AC
  Administered 2021-08-14: 6 via TOPICAL

## 2021-08-14 MED ORDER — PROPOFOL 500 MG/50ML IV EMUL
INTRAVENOUS | Status: AC
  Filled 2021-08-14: qty 100

## 2021-08-14 MED ORDER — PROPOFOL 10 MG/ML IV BOLUS
INTRAVENOUS | Status: DC | PRN
  Administered 2021-08-14: 50 mg via INTRAVENOUS
  Administered 2021-08-14: 200 mg via INTRAVENOUS

## 2021-08-14 MED ORDER — BUPIVACAINE-EPINEPHRINE 0.25% -1:200000 IJ SOLN
INTRAMUSCULAR | Status: DC | PRN
  Administered 2021-08-14: 10 mL

## 2021-08-14 MED ORDER — DEXMEDETOMIDINE (PRECEDEX) IN NS 20 MCG/5ML (4 MCG/ML) IV SYRINGE
PREFILLED_SYRINGE | INTRAVENOUS | Status: AC
  Filled 2021-08-14: qty 5

## 2021-08-14 MED ORDER — PROPOFOL 10 MG/ML IV BOLUS
INTRAVENOUS | Status: AC
Start: 2021-08-14 — End: ?
  Filled 2021-08-14: qty 20

## 2021-08-14 MED ORDER — FENTANYL CITRATE (PF) 100 MCG/2ML IJ SOLN
INTRAMUSCULAR | Status: AC
  Filled 2021-08-14: qty 2

## 2021-08-14 SURGICAL SUPPLY — 54 items
APL PRP STRL LF DISP 70% ISPRP (MISCELLANEOUS) ×1
APL SKNCLS STERI-STRIP NONHPOA (GAUZE/BANDAGES/DRESSINGS) ×1
APPLIER CLIP 9.375 MED OPEN (MISCELLANEOUS) ×2
APR CLP MED 9.3 20 MLT OPN (MISCELLANEOUS) ×1
BENZOIN TINCTURE PRP APPL 2/3 (GAUZE/BANDAGES/DRESSINGS) ×3 IMPLANT
BLADE HEX COATED 2.75 (ELECTRODE) ×2 IMPLANT
BLADE SURG 15 STRL LF DISP TIS (BLADE) ×2 IMPLANT
BLADE SURG 15 STRL SS (BLADE) ×2
CANISTER SUC SOCK COL 7IN (MISCELLANEOUS) IMPLANT
CANISTER SUCT 1200ML W/VALVE (MISCELLANEOUS) IMPLANT
CHLORAPREP W/TINT 26 (MISCELLANEOUS) ×3 IMPLANT
CLIP APPLIE 9.375 MED OPEN (MISCELLANEOUS) ×2 IMPLANT
COVER BACK TABLE 60X90IN (DRAPES) ×3 IMPLANT
COVER MAYO STAND STRL (DRAPES) ×3 IMPLANT
COVER PROBE W GEL 5X96 (DRAPES) ×3 IMPLANT
DRAPE LAPAROTOMY 100X72 PEDS (DRAPES) ×3 IMPLANT
DRAPE UTILITY XL STRL (DRAPES) ×3 IMPLANT
DRSG TEGADERM 4X4.75 (GAUZE/BANDAGES/DRESSINGS) ×3 IMPLANT
ELECT REM PT RETURN 9FT ADLT (ELECTROSURGICAL) ×2
ELECTRODE REM PT RTRN 9FT ADLT (ELECTROSURGICAL) ×2 IMPLANT
GAUZE SPONGE 4X4 12PLY STRL LF (GAUZE/BANDAGES/DRESSINGS) IMPLANT
GLOVE BIO SURGEON STRL SZ7 (GLOVE) ×3 IMPLANT
GLOVE BIOGEL PI IND STRL 7.0 (GLOVE) IMPLANT
GLOVE BIOGEL PI IND STRL 7.5 (GLOVE) ×2 IMPLANT
GLOVE BIOGEL PI INDICATOR 7.0 (GLOVE) ×2
GLOVE BIOGEL PI INDICATOR 7.5 (GLOVE) ×1
GLOVE SURG SS PI 6.5 STRL IVOR (GLOVE) ×1 IMPLANT
GOWN STRL REUS W/ TWL LRG LVL3 (GOWN DISPOSABLE) ×4 IMPLANT
GOWN STRL REUS W/TWL LRG LVL3 (GOWN DISPOSABLE) ×6
ILLUMINATOR WAVEGUIDE N/F (MISCELLANEOUS) IMPLANT
KIT MARKER MARGIN INK (KITS) ×3 IMPLANT
LIGHT WAVEGUIDE WIDE FLAT (MISCELLANEOUS) IMPLANT
MAT PREVALON FULL STRYKER (MISCELLANEOUS) ×1 IMPLANT
NDL HYPO 25X1 1.5 SAFETY (NEEDLE) ×2 IMPLANT
NEEDLE HYPO 25X1 1.5 SAFETY (NEEDLE) ×2 IMPLANT
NS IRRIG 1000ML POUR BTL (IV SOLUTION) ×2 IMPLANT
PACK BASIN DAY SURGERY FS (CUSTOM PROCEDURE TRAY) ×3 IMPLANT
PENCIL SMOKE EVACUATOR (MISCELLANEOUS) ×3 IMPLANT
SLEEVE SCD COMPRESS KNEE MED (STOCKING) ×3 IMPLANT
SPIKE FLUID TRANSFER (MISCELLANEOUS) IMPLANT
SPONGE GAUZE 2X2 8PLY STRL LF (GAUZE/BANDAGES/DRESSINGS) ×1 IMPLANT
SPONGE T-LAP 18X18 ~~LOC~~+RFID (SPONGE) IMPLANT
SPONGE T-LAP 4X18 ~~LOC~~+RFID (SPONGE) ×3 IMPLANT
STRIP CLOSURE SKIN 1/2X4 (GAUZE/BANDAGES/DRESSINGS) ×3 IMPLANT
SUT MON AB 4-0 PC3 18 (SUTURE) ×3 IMPLANT
SUT SILK 2 0 SH (SUTURE) IMPLANT
SUT VIC AB 3-0 SH 27 (SUTURE) ×2
SUT VIC AB 3-0 SH 27X BRD (SUTURE) ×2 IMPLANT
SYR BULB EAR ULCER 3OZ GRN STR (SYRINGE) IMPLANT
SYR CONTROL 10ML LL (SYRINGE) ×3 IMPLANT
TOWEL GREEN STERILE FF (TOWEL DISPOSABLE) ×3 IMPLANT
TRAY FAXITRON CT DISP (TRAY / TRAY PROCEDURE) ×3 IMPLANT
TUBE CONNECTING 20X1/4 (TUBING) IMPLANT
YANKAUER SUCT BULB TIP NO VENT (SUCTIONS) IMPLANT

## 2021-08-14 NOTE — Interval H&P Note (Signed)
History and Physical Interval Note:  08/14/2021 9:07 AM  Regina Lindsey  has presented today for surgery, with the diagnosis of LEFT BREAST PAPILLARY LESION WITH FOCAL ATYPIA.  The various methods of treatment have been discussed with the patient and family. After consideration of risks, benefits and other options for treatment, the patient has consented to  Procedure(s) with comments: LEFT BREAST RADIOACTIVE SEED LOCALIZED LUMPECTOMY (Left) - LMA as a surgical intervention.  The patient's history has been reviewed, patient examined, no change in status, stable for surgery.  I have reviewed the patient's chart and labs.  Questions were answered to the patient's satisfaction.     Maia Petties

## 2021-08-14 NOTE — Transfer of Care (Signed)
Immediate Anesthesia Transfer of Care Note  Patient: Regina Lindsey  Procedure(s) Performed: LEFT BREAST RADIOACTIVE SEED LOCALIZED LUMPECTOMY (Left: Breast)  Patient Location: PACU  Anesthesia Type:General  Level of Consciousness: drowsy  Airway & Oxygen Therapy: Patient Spontanous Breathing and Patient connected to face mask oxygen  Post-op Assessment: Report given to RN and Post -op Vital signs reviewed and stable  Post vital signs: Reviewed and stable  Last Vitals:  Vitals Value Taken Time  BP 92/52 08/14/21 1118  Temp    Pulse 81 08/14/21 1120  Resp 11 08/14/21 1120  SpO2 96 % 08/14/21 1120  Vitals shown include unvalidated device data.  Last Pain:  Vitals:   08/14/21 0850  TempSrc: Oral  PainSc: 0-No pain      Patients Stated Pain Goal: 3 (50/38/88 2800)  Complications: No notable events documented.

## 2021-08-14 NOTE — Op Note (Signed)
Pre-op Diagnosis:  Left breast mass  Post-op Diagnosis: same Procedure:  Left radioactive seed localized lumpectomy Surgeon:  TSUEI,MATTHEW K. 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  She then underwent further work-up which showed a 0.5 x 0.7 cm cluster of calcifications.  Biopsy revealed focal atypia arising within a sclerotic papillary lesion with calcifications.  No sign of malignancy.  She is referred to us to discuss excision.  The patient has no family history of breast cancer and has had no previous breast problems. A radioactive seed was placed yesterday by radiology Description of procedure: The patient is brought to the operating room placed in supine position on the operating room table. After an adequate level of general anesthesia was obtained, her left breast was prepped with ChloraPrep and draped in sterile fashion. A timeout was taken to ensure the proper patient and proper procedure. We interrogated the breast with the neoprobe. We made a circumareolar incision around the medial side of the nipple after infiltrating with 0.25% Marcaine. Dissection was carried down in the breast tissue with cautery. We used the neoprobe to guide us towards the radioactive seed. We excised an area of tissue around the radioactive seed 1.5 cm in diameter. The specimen was removed and was oriented with a paint kit. Specimen mammogram showed the radioactive seed as well as the biopsy clip within the specimen. This was sent for pathologic examination. There is no residual radioactivity within the biopsy cavity. We inspected carefully for hemostasis. The wound was thoroughly irrigated. Marking clips were placed.  The wound was closed with a deep layer of 3-0 Vicryl and a subcuticular layer of 4-0 Monocryl. Benzoin Steri-Strips  were applied. The patient was then extubated and brought to the recovery room in stable condition. All sponge, instrument, and needle counts are correct.  Matthew K. Tsuei, MD, FACS [facility]  General/ Trauma Surgery  08/14/2021 11:14 AM   

## 2021-08-14 NOTE — Anesthesia Preprocedure Evaluation (Addendum)
Anesthesia Evaluation  Patient identified by MRN, date of birth, ID band Patient awake    Reviewed: Allergy & Precautions, NPO status , Patient's Chart, lab work & pertinent test results  History of Anesthesia Complications (+) PONV and history of anesthetic complications  Airway Mallampati: III  TM Distance: >3 FB Neck ROM: Full    Dental   Pulmonary former smoker,    Pulmonary exam normal breath sounds clear to auscultation       Cardiovascular negative cardio ROS Normal cardiovascular exam     Neuro/Psych  Headaches, PSYCHIATRIC DISORDERS Depression    GI/Hepatic negative GI ROS, Neg liver ROS,   Endo/Other  Morbid obesity  Renal/GU negative Renal ROS     Musculoskeletal negative musculoskeletal ROS (+)   Abdominal (+) + obese,   Peds  Hematology negative hematology ROS (+)   Anesthesia Other Findings LEFT BREAST PAPILLARY LESION WITH FOCAL ATYPIA  Reproductive/Obstetrics                            Anesthesia Physical Anesthesia Plan  ASA: 3  Anesthesia Plan: General   Post-op Pain Management:    Induction: Intravenous  PONV Risk Score and Plan: 4 or greater and Ondansetron, Dexamethasone, Treatment may vary due to age or medical condition, Propofol infusion and Amisulpride  Airway Management Planned: LMA  Additional Equipment:   Intra-op Plan:   Post-operative Plan: Extubation in OR  Informed Consent: I have reviewed the patients History and Physical, chart, labs and discussed the procedure including the risks, benefits and alternatives for the proposed anesthesia with the patient or authorized representative who has indicated his/her understanding and acceptance.     Dental advisory given  Plan Discussed with: CRNA  Anesthesia Plan Comments:        Anesthesia Quick Evaluation

## 2021-08-14 NOTE — Anesthesia Postprocedure Evaluation (Signed)
Anesthesia Post Note  Patient: Regina Lindsey  Procedure(s) Performed: LEFT BREAST RADIOACTIVE SEED LOCALIZED LUMPECTOMY (Left: Breast)     Patient location during evaluation: PACU Anesthesia Type: General Level of consciousness: awake Pain management: pain level controlled Vital Signs Assessment: post-procedure vital signs reviewed and stable Respiratory status: spontaneous breathing, nonlabored ventilation, respiratory function stable and patient connected to nasal cannula oxygen Cardiovascular status: blood pressure returned to baseline and stable Postop Assessment: no apparent nausea or vomiting Anesthetic complications: no   No notable events documented.  Last Vitals:  Vitals:   08/14/21 1145 08/14/21 1217  BP: (!) 93/49 124/63  Pulse: 73 68  Resp: 10   Temp:  36.6 C  SpO2: 95% 96%    Last Pain:  Vitals:   08/14/21 1206  TempSrc:   PainSc: 0-No pain                 Gianni Mihalik P Tamu Golz

## 2021-08-14 NOTE — Discharge Instructions (Addendum)
[place] Office Phone Number [phone]  BREAST BIOPSY/ PARTIAL MASTECTOMY: POST OP INSTRUCTIONS  Always review your discharge instruction sheet given to you by the facility where your surgery was performed.  IF YOU HAVE DISABILITY OR FAMILY LEAVE FORMS, YOU MUST BRING THEM TO THE OFFICE FOR PROCESSING.  DO NOT GIVE THEM TO YOUR DOCTOR.  A prescription for pain medication may be given to you upon discharge.  Take your pain medication as prescribed, if needed.  If narcotic pain medicine is not needed, then you may take acetaminophen (Tylenol) or ibuprofen (Advil) as needed. Take your usually prescribed medications unless otherwise directed If you need a refill on your pain medication, please contact your pharmacy.  They will contact our office to request authorization.  Prescriptions will not be filled after 5pm or on week-ends. You should eat very light the first 24 hours after surgery, such as soup, crackers, pudding, etc.  Resume your normal diet the day after surgery. Most patients will experience some swelling and bruising in the breast.  Ice packs and a good support bra will help.  Swelling and bruising can take several days to resolve.  It is common to experience some constipation if taking pain medication after surgery.  Increasing fluid intake and taking a stool softener will usually help or prevent this problem from occurring.  A mild laxative (Milk of Magnesia or Miralax) should be taken according to package directions if there are no bowel movements after 48 hours. Unless discharge instructions indicate otherwise, you may remove your bandages 24-48 hours after surgery, and you may shower at that time.  You may have steri-strips (small skin tapes) in place directly over the incision.  These strips should be left on the skin for 7-10 days.  If your surgeon used skin glue on the incision, you may shower in 24 hours.  The glue will flake off over the next 2-3 weeks.  Any  sutures or staples will be removed at the office during your follow-up visit. ACTIVITIES:  You may resume regular daily activities (gradually increasing) beginning the next day.  Wearing a good support bra or sports bra minimizes pain and swelling.  You may have sexual intercourse when it is comfortable. You may drive when you no longer are taking prescription pain medication, you can comfortably wear a seatbelt, and you can safely maneuver your car and apply brakes. RETURN TO WORK:  ______________________________________________________________________________________ Dennis Bast should see your doctor in the office for a follow-up appointment approximately two weeks after your surgery.  Your doctor's nurse will typically make your follow-up appointment when she calls you with your pathology report.  Expect your pathology report 2-3 business days after your surgery.  You may call to check if you do not hear from [place] after three days. OTHER INSTRUCTIONS: _______________________________________________________________________________________________ _____________________________________________________________________________________________________________________________________ _____________________________________________________________________________________________________________________________________ _____________________________________________________________________________________________________________________________________  WHEN TO CALL YOUR DOCTOR: Fever over 101.0 Nausea and/or vomiting. Extreme swelling or bruising. Continued bleeding from incision. Increased pain, redness, or drainage from the incision.  The clinic staff is available to answer your questions during regular business hours.  Please don't hesitate to call and ask to speak to one of the nurses for clinical concerns.  If you have a medical emergency, go to the nearest emergency room or call 911.  A surgeon from [facility] is always on call at the hospital.  For further questions, please visit [email or web address]   No Tylenol before 3:00pm if needed.  Post Anesthesia Home Care Instructions  Activity: Get plenty  of rest for the remainder of the day. A responsible individual must stay with you for 24 hours following the procedure.  For the next 24 hours, DO NOT: -Drive a car -Paediatric nurse -Drink alcoholic beverages -Take any medication unless instructed by your physician -Make any legal decisions or sign important papers.  Meals: Start with liquid foods such as gelatin or soup. Progress to regular foods as tolerated. Avoid greasy, spicy, heavy foods. If nausea and/or vomiting occur, drink only clear liquids until the nausea and/or vomiting subsides. Call your physician if vomiting continues.  Special Instructions/Symptoms: Your throat may feel dry or sore from the anesthesia or the breathing tube placed in your throat during surgery. If this causes discomfort, gargle with warm salt water. The discomfort should disappear within 24 hours.  If you had a scopolamine patch placed behind your ear for the management of post- operative nausea and/or vomiting:  1. The medication in the patch is effective for 72 hours, after which it should be removed.  Wrap patch in a tissue and discard in the trash. Wash hands thoroughly with soap and water. 2. You may remove the patch earlier than 72 hours if you experience unpleasant side effects which may include dry mouth, dizziness or visual disturbances. 3. Avoid touching the patch. Wash your hands with soap and water after contact with the patch.

## 2021-08-14 NOTE — Anesthesia Procedure Notes (Signed)
Procedure Name: LMA Insertion Date/Time: 08/14/2021 10:41 AM  Performed by: Ezequiel Kayser, CRNAPre-anesthesia Checklist: Patient identified, Emergency Drugs available, Suction available and Patient being monitored Patient Re-evaluated:Patient Re-evaluated prior to induction Oxygen Delivery Method: Circle System Utilized Preoxygenation: Pre-oxygenation with 100% oxygen Induction Type: IV induction Ventilation: Mask ventilation without difficulty LMA: LMA inserted LMA Size: 4.0 Number of attempts: 1 Airway Equipment and Method: Bite block Placement Confirmation: positive ETCO2 Tube secured with: Tape Dental Injury: Teeth and Oropharynx as per pre-operative assessment

## 2021-08-15 ENCOUNTER — Encounter (HOSPITAL_BASED_OUTPATIENT_CLINIC_OR_DEPARTMENT_OTHER): Payer: Self-pay | Admitting: Surgery

## 2021-08-16 LAB — SURGICAL PATHOLOGY

## 2021-08-24 ENCOUNTER — Encounter (HOSPITAL_COMMUNITY): Payer: Self-pay

## 2021-10-03 ENCOUNTER — Encounter (INDEPENDENT_AMBULATORY_CARE_PROVIDER_SITE_OTHER): Payer: Self-pay

## 2022-07-01 ENCOUNTER — Other Ambulatory Visit: Payer: Self-pay | Admitting: Family Medicine

## 2022-07-01 DIAGNOSIS — Z1231 Encounter for screening mammogram for malignant neoplasm of breast: Secondary | ICD-10-CM

## 2022-07-12 ENCOUNTER — Ambulatory Visit
Admission: RE | Admit: 2022-07-12 | Discharge: 2022-07-12 | Disposition: A | Discharge status: Home / Self Care | Payer: No Typology Code available for payment source | Source: Ambulatory Visit | Attending: Family Medicine | Admitting: Family Medicine

## 2022-07-12 ENCOUNTER — Ambulatory Visit: Payer: No Typology Code available for payment source

## 2022-07-12 DIAGNOSIS — Z1231 Encounter for screening mammogram for malignant neoplasm of breast: Secondary | ICD-10-CM

## 2023-03-22 IMAGING — MG MM BREAST BX W LOC DEV 1ST LESION IMAGE BX SPEC STEREO GUIDE*L*
6 series · 6 of 14 positions shown · non-contrast
Comparison: None Available.
COMPARISON: None Available.

Addendum:
CLINICAL DATA: 60-year-old female for tissue sampling of 0.7 cm
group of INNER LEFT breast calcifications.

EXAM:
LEFT BREAST STEREOTACTIC CORE NEEDLE BIOPSY

[L (1 of 3)]
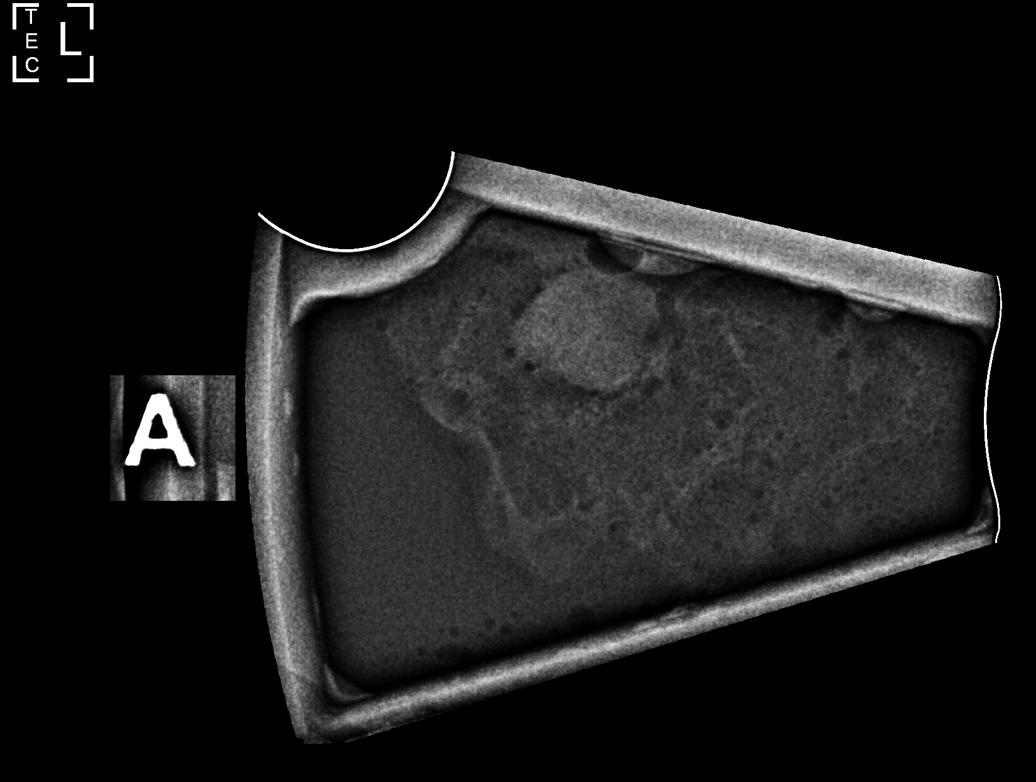

[L (2 of 3)]
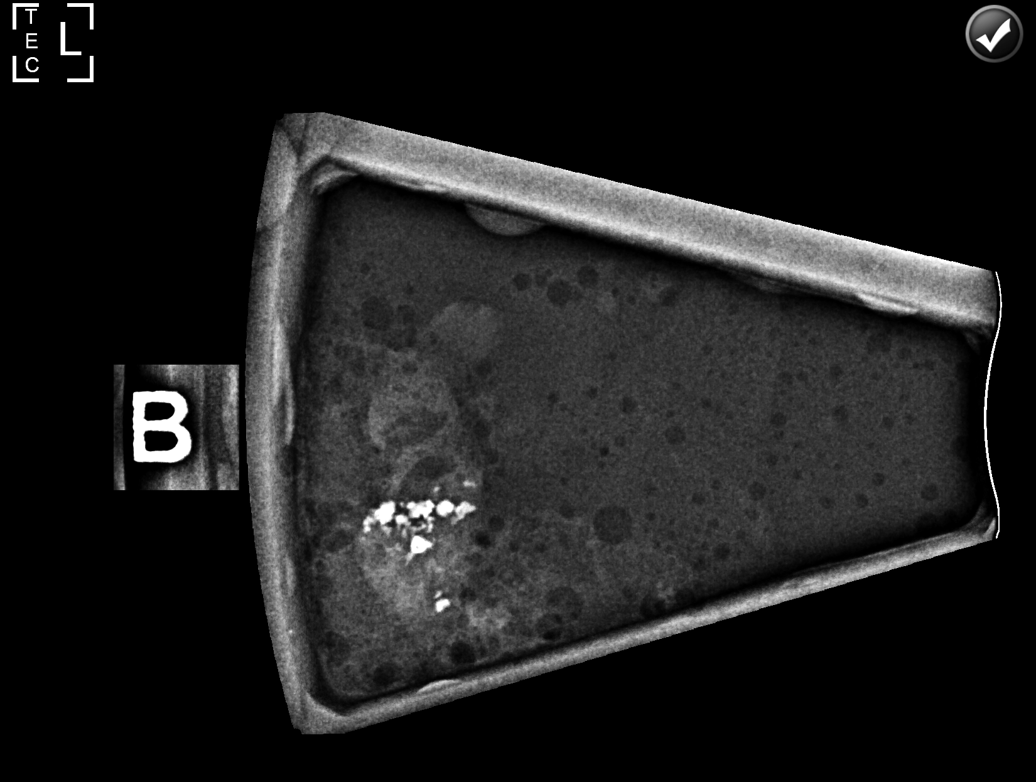

[L (3 of 3)]
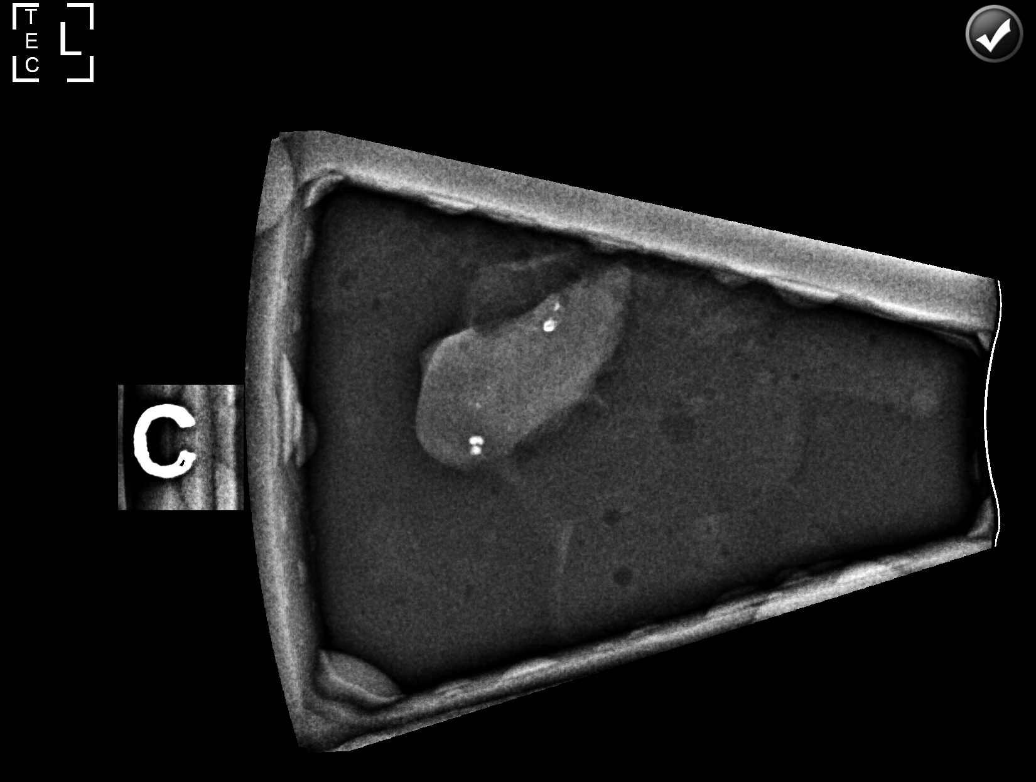

[L CC]
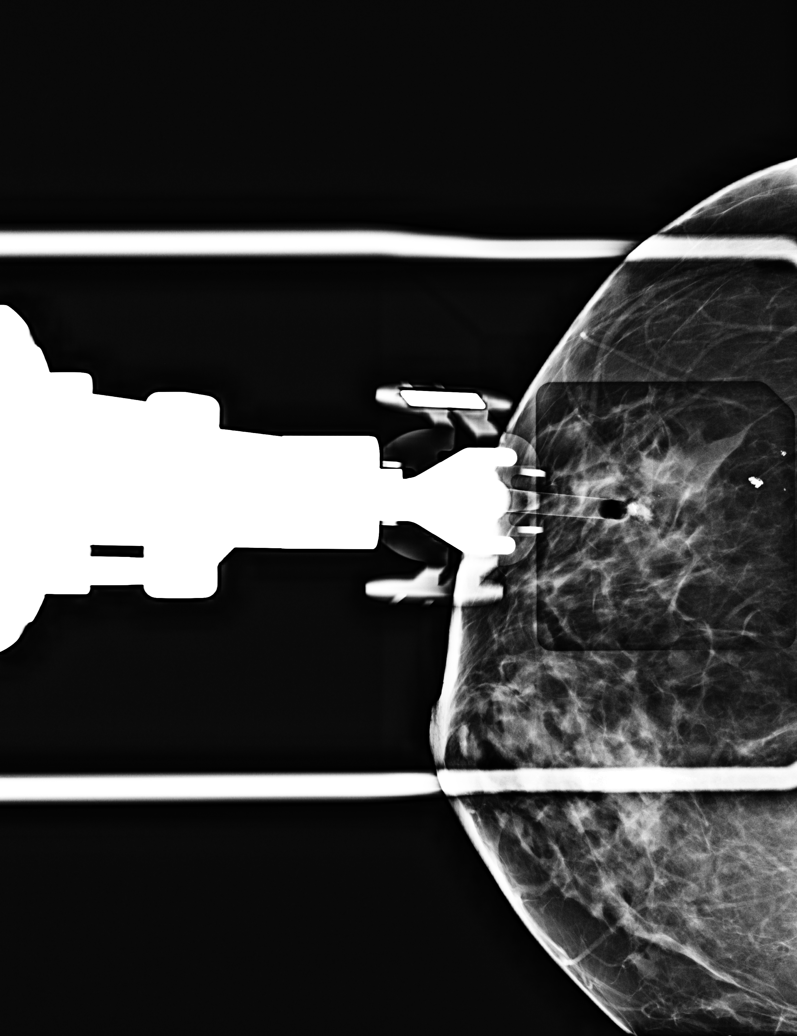

[L CC tomo (1 of 2) · tomo slice 27/52.0]
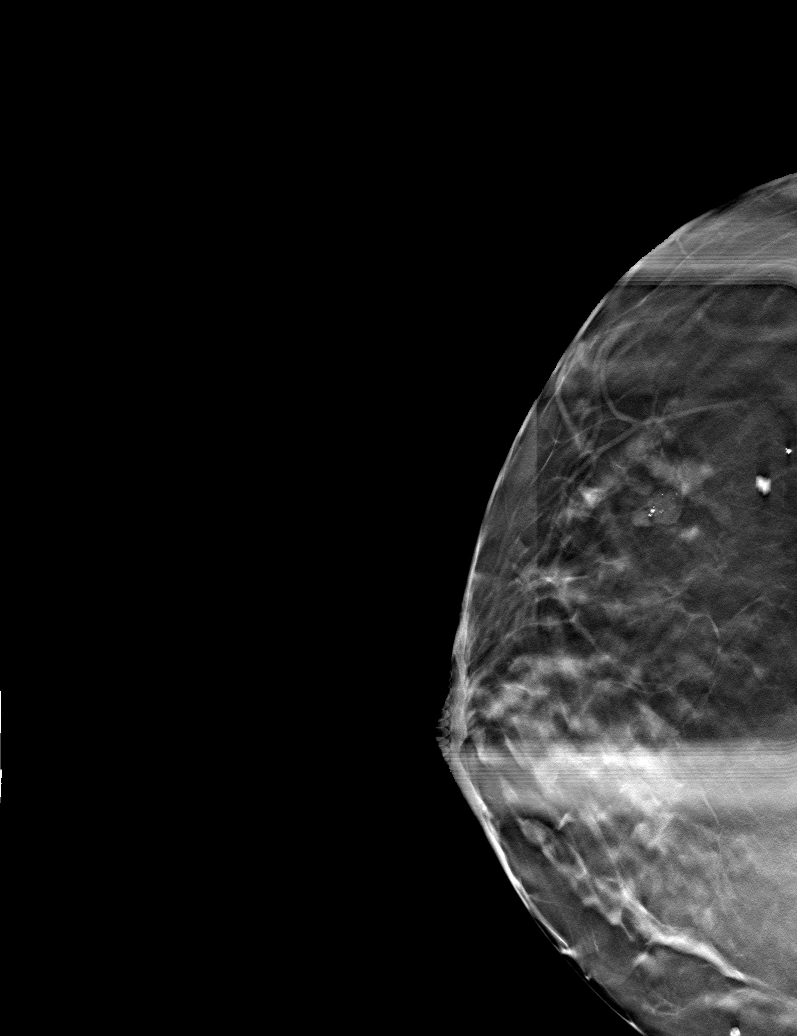

[L CC tomo (2 of 2) · tomo slice 27/52.0]
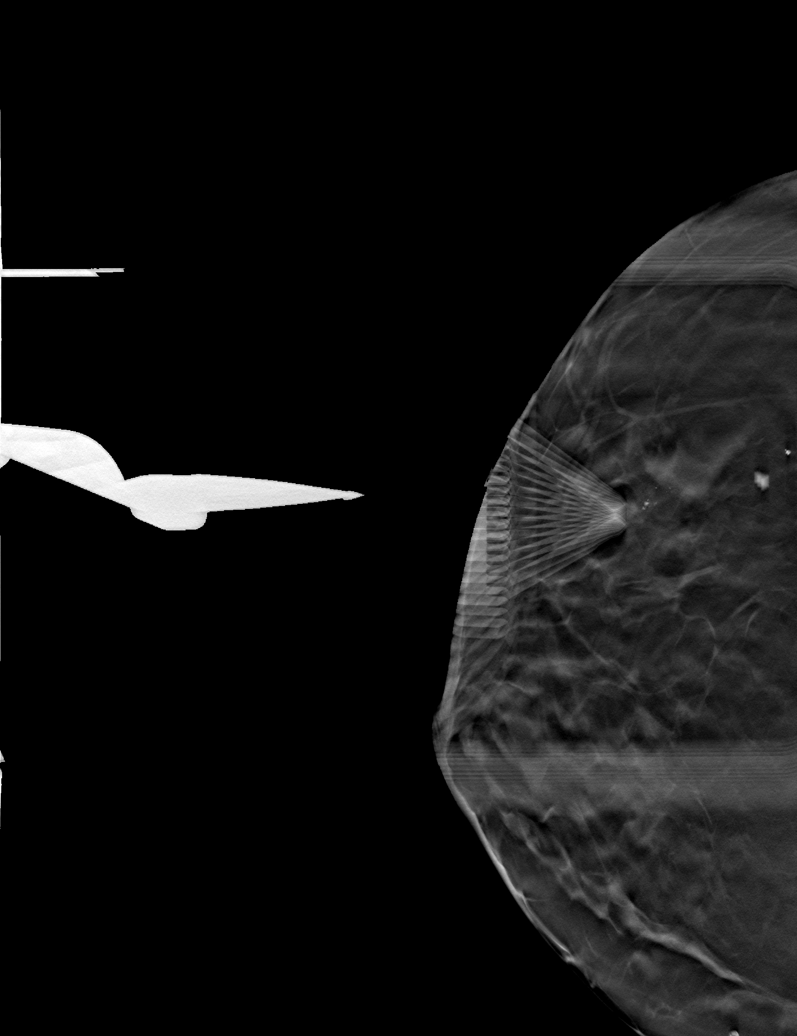

[6 of 14 positions shown; findings below may reference images not displayed]



Using sterile technique and 1% Lidocaine with and without
epinephrine as local anesthetic, under stereotactic guidance, a 9
gauge vacuum assisted device was used to perform core needle biopsy
of the 0.7 cm group of INNER LEFT breast calcifications using a
SUPERIOR approach. Specimen radiograph was performed showing
calcifications. Specimens with calcifications are identified for
pathology.

At the conclusion of the procedure, a RIBBON shaped tissue marker
clip was deployed into the biopsy cavity. Follow-up 2-view mammogram
was performed and dictated separately.
IMPRESSION: Stereotactic-guided biopsy of 0.7 cm group of INNER LEFT breast
calcifications. No apparent complications.

ADDENDUM:
Pathology revealed FOCAL ATYPIA ARISING IN A SCLEROTIC PAPILLARY
LESION WITH CALCIFICATIONS, SCLEROTIC FIBROADENOMATOID NODULE of the
LEFT breast, inner, (ribbon clip). This was found to be concordant
by Dr. Chayo Ballenger, with surgical consultation for excision recommended,
per [HOSPITAL] Breast Working Group protocol.

Pathology results were discussed with the patient by telephone. The
patient reported doing well after the biopsy with tenderness at the
site. Post biopsy instructions and care were reviewed, and questions
were answered. The patient was encouraged to call The [REDACTED] for any additional concerns. My direct phone
number was provided.

Surgical consultation has been arranged with Dr. Sadayoshi Asson at
[REDACTED] on July 16, 2021.

Pathology results reported by Ara Locklear, RN on 07/11/2021.



Using sterile technique and 1% Lidocaine with and without
epinephrine as local anesthetic, under stereotactic guidance, a 9
gauge vacuum assisted device was used to perform core needle biopsy
of the 0.7 cm group of INNER LEFT breast calcifications using a
SUPERIOR approach. Specimen radiograph was performed showing
calcifications. Specimens with calcifications are identified for
pathology.

At the conclusion of the procedure, a RIBBON shaped tissue marker
clip was deployed into the biopsy cavity. Follow-up 2-view mammogram
was performed and dictated separately.
IMPRESSION: Stereotactic-guided biopsy of 0.7 cm group of INNER LEFT breast
calcifications. No apparent complications.

## 2023-04-30 IMAGING — MG MM BREAST SURGICAL SPECIMEN
1 series · 2 of 2 positions shown · non-contrast
Comparison: None Available.

CLINICAL DATA: Patient is post radioactive seed localization
subsequent surgical excision left breast.

EXAM:
SPECIMEN RADIOGRAPH OF THE LEFT BREAST

[Series 1: L · left · 0.07mm/px · 2 of 2 slices shown]
[im 1/2]
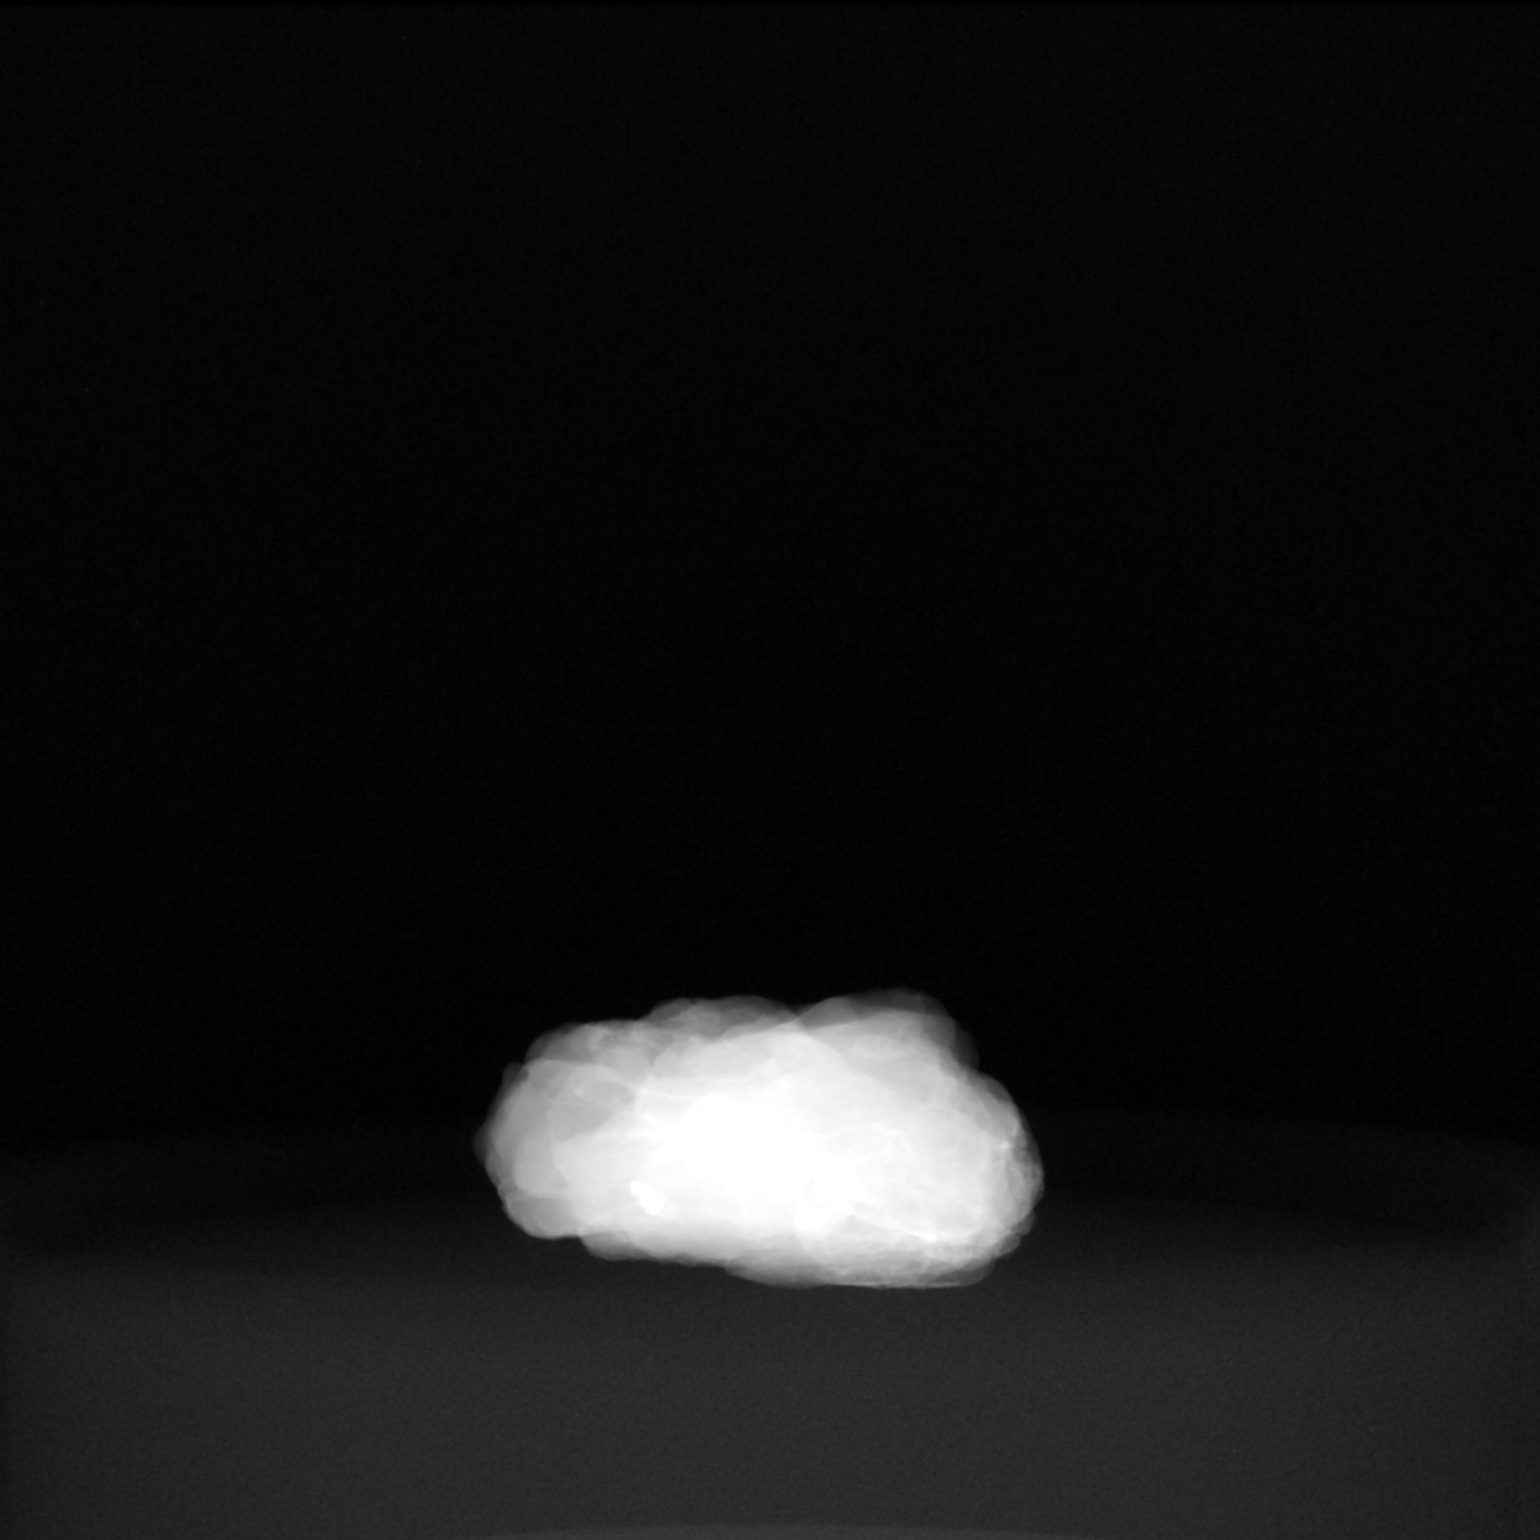
[im 2/2]
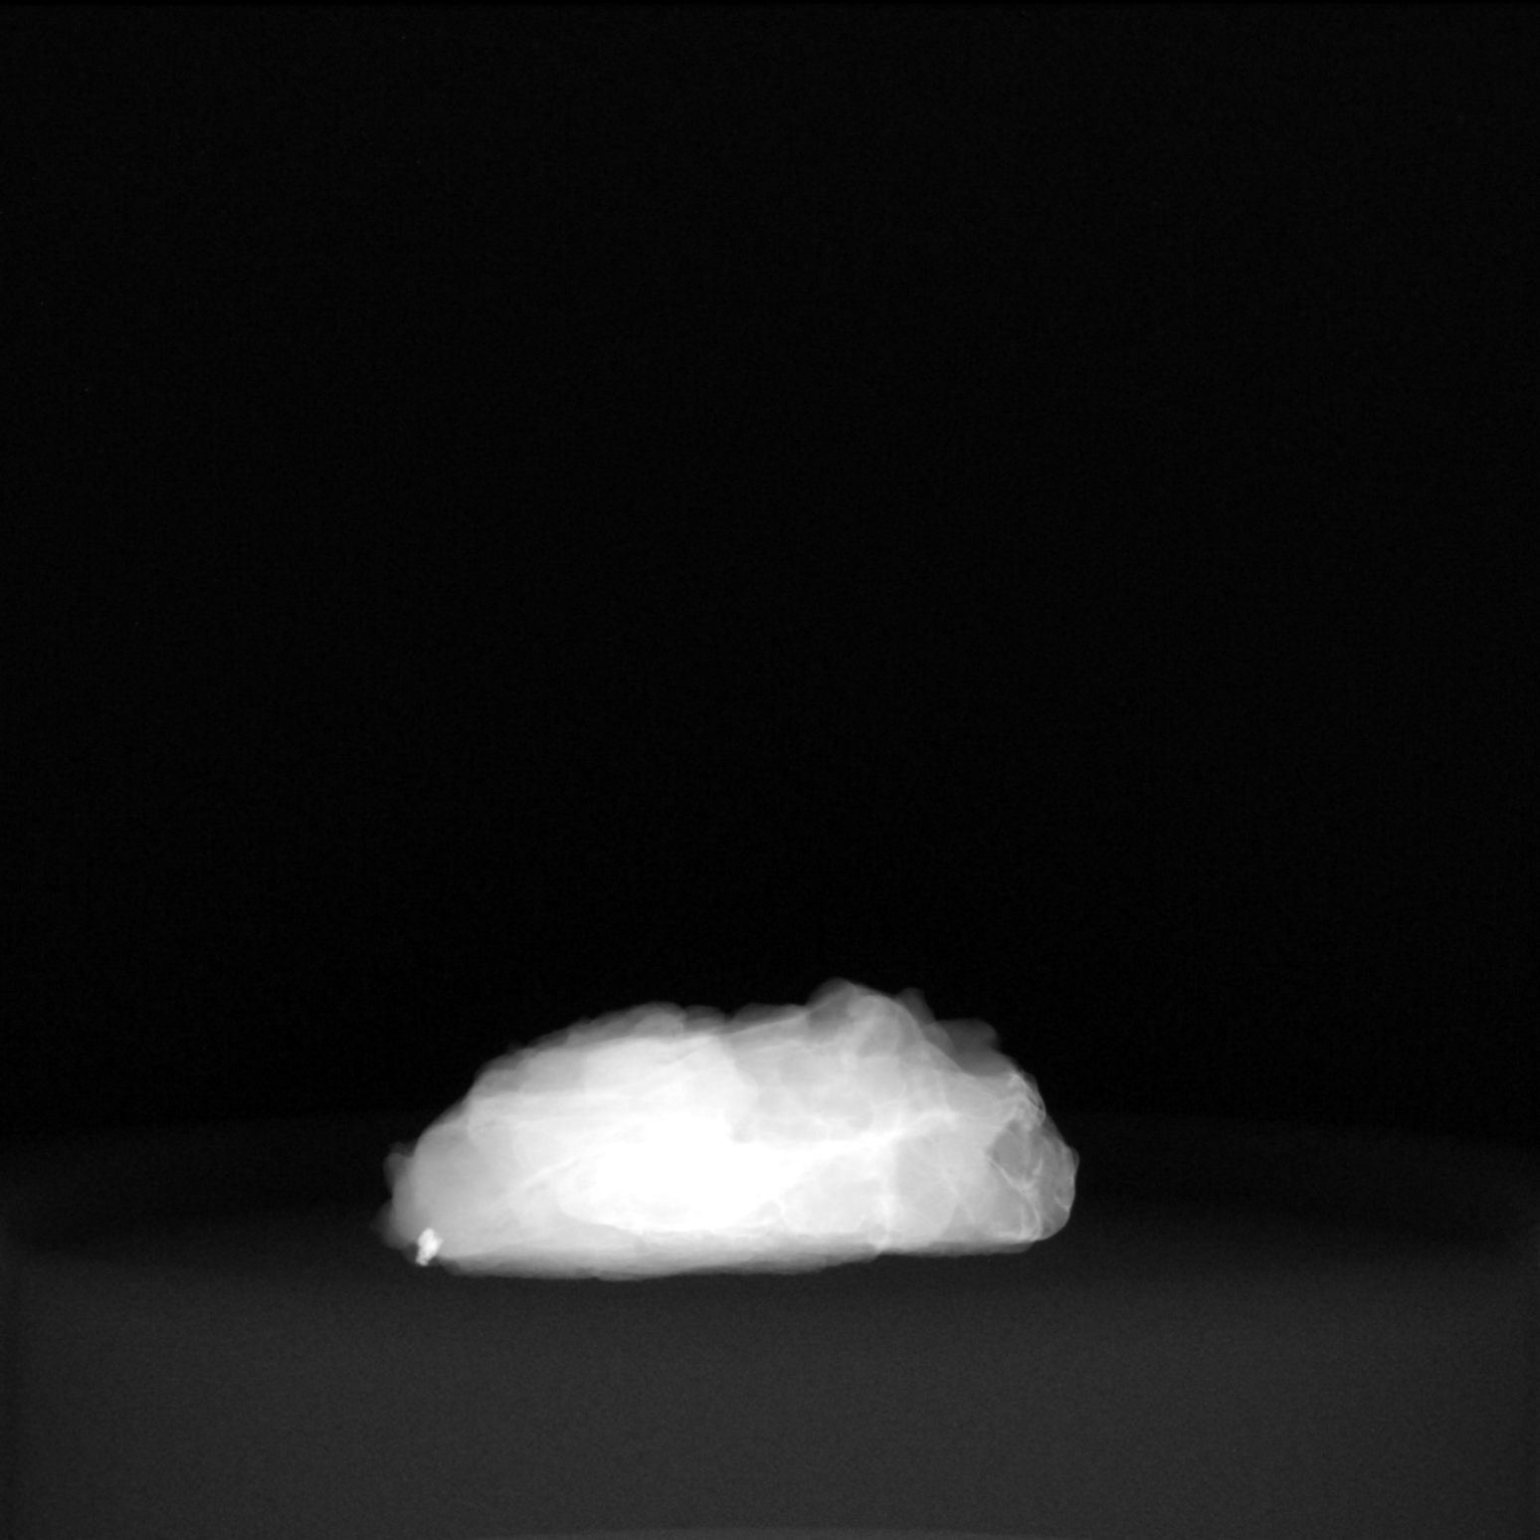

[2 of 2 positions shown; findings below may reference images not displayed]

FINDINGS: Status post excision of the left breast. The radioactive seed and
biopsy marker clip are present, completely intact, and were marked
for pathology. Results called to the OR at the time of dictation.
IMPRESSION: Specimen radiograph of the left breast.

## 2023-06-19 ENCOUNTER — Other Ambulatory Visit: Payer: Self-pay | Admitting: Family Medicine

## 2023-06-19 DIAGNOSIS — Z1231 Encounter for screening mammogram for malignant neoplasm of breast: Secondary | ICD-10-CM

## 2023-07-18 ENCOUNTER — Ambulatory Visit
Admission: RE | Admit: 2023-07-18 | Discharge: 2023-07-18 | Disposition: A | Discharge status: Home / Self Care | Source: Ambulatory Visit | Attending: Family Medicine | Admitting: Family Medicine

## 2023-07-18 DIAGNOSIS — Z1231 Encounter for screening mammogram for malignant neoplasm of breast: Secondary | ICD-10-CM

## 2023-09-17 ENCOUNTER — Other Ambulatory Visit (HOSPITAL_COMMUNITY): Payer: Self-pay

## 2023-12-23 ENCOUNTER — Other Ambulatory Visit: Payer: Self-pay | Admitting: Family Medicine

## 2023-12-23 DIAGNOSIS — N644 Mastodynia: Secondary | ICD-10-CM

## 2023-12-25 ENCOUNTER — Other Ambulatory Visit: Payer: Self-pay | Admitting: Medical Genetics

## 2024-01-01 ENCOUNTER — Ambulatory Visit
Admission: RE | Admit: 2024-01-01 | Discharge: 2024-01-01 | Disposition: A | Source: Ambulatory Visit | Attending: Family Medicine

## 2024-01-01 ENCOUNTER — Ambulatory Visit
Admission: RE | Admit: 2024-01-01 | Discharge: 2024-01-01 | Disposition: A | Discharge status: Home / Self Care | Source: Ambulatory Visit | Attending: Family Medicine | Admitting: Family Medicine

## 2024-01-01 DIAGNOSIS — N644 Mastodynia: Secondary | ICD-10-CM

## 2024-03-12 ENCOUNTER — Other Ambulatory Visit (HOSPITAL_COMMUNITY)
Admission: RE | Admit: 2024-03-12 | Discharge: 2024-03-12 | Disposition: A | Discharge status: Home / Self Care | Payer: Self-pay | Source: Ambulatory Visit | Attending: Medical Genetics | Admitting: Medical Genetics

## 2024-03-22 LAB — GENECONNECT MOLECULAR SCREEN: Genetic Analysis Overall Interpretation: NEGATIVE
